# Patient Record
Sex: Male | Born: 1980
Health system: Southern US, Community
[De-identification: ages and names within clinical notes are randomized; demographics above are authoritative.]

## PROBLEM LIST (undated history)

## (undated) DIAGNOSIS — E119 Type 2 diabetes mellitus without complications: Secondary | ICD-10-CM

## (undated) DIAGNOSIS — E1165 Type 2 diabetes mellitus with hyperglycemia: Secondary | ICD-10-CM

## (undated) HISTORY — DX: Type 2 diabetes mellitus without complications: E11.9

---
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
## 2019-03-31 ENCOUNTER — Inpatient Hospital Stay (HOSPITAL_COMMUNITY)
Admission: EM | Admit: 2019-03-31 | Discharge: 2019-04-05 | DRG: 442 | Disposition: A | Discharge status: Home Health | Payer: Self-pay | Attending: Internal Medicine | Admitting: Internal Medicine

## 2019-03-31 ENCOUNTER — Emergency Department (HOSPITAL_COMMUNITY): Payer: Self-pay

## 2019-03-31 ENCOUNTER — Other Ambulatory Visit: Payer: Self-pay

## 2019-03-31 ENCOUNTER — Inpatient Hospital Stay (HOSPITAL_COMMUNITY): Payer: Self-pay

## 2019-03-31 ENCOUNTER — Encounter (HOSPITAL_COMMUNITY): Payer: Self-pay | Admitting: Emergency Medicine

## 2019-03-31 DIAGNOSIS — N12 Tubulo-interstitial nephritis, not specified as acute or chronic: Secondary | ICD-10-CM | POA: Diagnosis present

## 2019-03-31 DIAGNOSIS — E119 Type 2 diabetes mellitus without complications: Secondary | ICD-10-CM | POA: Diagnosis present

## 2019-03-31 DIAGNOSIS — D649 Anemia, unspecified: Secondary | ICD-10-CM | POA: Diagnosis present

## 2019-03-31 DIAGNOSIS — L0291 Cutaneous abscess, unspecified: Secondary | ICD-10-CM

## 2019-03-31 DIAGNOSIS — K750 Abscess of liver: Principal | ICD-10-CM | POA: Diagnosis present

## 2019-03-31 DIAGNOSIS — K7689 Other specified diseases of liver: Secondary | ICD-10-CM | POA: Diagnosis present

## 2019-03-31 DIAGNOSIS — E871 Hypo-osmolality and hyponatremia: Secondary | ICD-10-CM | POA: Diagnosis present

## 2019-03-31 DIAGNOSIS — Z20828 Contact with and (suspected) exposure to other viral communicable diseases: Secondary | ICD-10-CM | POA: Diagnosis present

## 2019-03-31 DIAGNOSIS — B961 Klebsiella pneumoniae [K. pneumoniae] as the cause of diseases classified elsewhere: Secondary | ICD-10-CM | POA: Diagnosis present

## 2019-03-31 DIAGNOSIS — R7881 Bacteremia: Secondary | ICD-10-CM

## 2019-03-31 DIAGNOSIS — N410 Acute prostatitis: Secondary | ICD-10-CM | POA: Diagnosis present

## 2019-03-31 DIAGNOSIS — R911 Solitary pulmonary nodule: Secondary | ICD-10-CM | POA: Diagnosis present

## 2019-03-31 LAB — CBC WITH DIFFERENTIAL/PLATELET
Abs Immature Granulocytes: 0.04 10*3/uL (ref 0.00–0.07)
Basophils Absolute: 0 10*3/uL (ref 0.0–0.1)
Basophils Relative: 0 %
Eosinophils Absolute: 0 10*3/uL (ref 0.0–0.5)
Eosinophils Relative: 0 %
HCT: 35.2 % — ABNORMAL LOW (ref 39.0–52.0)
Hemoglobin: 12 g/dL — ABNORMAL LOW (ref 13.0–17.0)
Immature Granulocytes: 1 %
Lymphocytes Relative: 3 %
Lymphs Abs: 0.3 10*3/uL — ABNORMAL LOW (ref 0.7–4.0)
MCH: 29.9 pg (ref 26.0–34.0)
MCHC: 34.1 g/dL (ref 30.0–36.0)
MCV: 87.8 fL (ref 80.0–100.0)
Monocytes Absolute: 0.2 10*3/uL (ref 0.1–1.0)
Monocytes Relative: 2 %
Neutro Abs: 8 10*3/uL — ABNORMAL HIGH (ref 1.7–7.7)
Neutrophils Relative %: 94 %
Platelets: 334 10*3/uL (ref 150–400)
RBC: 4.01 MIL/uL — ABNORMAL LOW (ref 4.22–5.81)
RDW: 11.4 % — ABNORMAL LOW (ref 11.5–15.5)
WBC: 8.5 10*3/uL (ref 4.0–10.5)
nRBC: 0 % (ref 0.0–0.2)

## 2019-03-31 LAB — BASIC METABOLIC PANEL
Anion gap: 15 (ref 5–15)
BUN: 14 mg/dL (ref 6–20)
CO2: 21 mmol/L — ABNORMAL LOW (ref 22–32)
Calcium: 8.1 mg/dL — ABNORMAL LOW (ref 8.9–10.3)
Chloride: 96 mmol/L — ABNORMAL LOW (ref 98–111)
Creatinine, Ser: 0.85 mg/dL (ref 0.61–1.24)
GFR calc Af Amer: >60 mL/min (ref >60)
GFR calc non Af Amer: >60 mL/min (ref >60)
Glucose, Bld: 255 mg/dL — ABNORMAL HIGH (ref 70–99)
Potassium: 3.5 mmol/L (ref 3.5–5.1)
Sodium: 132 mmol/L — ABNORMAL LOW (ref 135–145)

## 2019-03-31 LAB — COMPREHENSIVE METABOLIC PANEL
ALT: 56 U/L — ABNORMAL HIGH (ref 0–44)
AST: 38 U/L (ref 15–41)
Albumin: 2.5 g/dL — ABNORMAL LOW (ref 3.5–5.0)
Alkaline Phosphatase: 405 U/L — ABNORMAL HIGH (ref 38–126)
Anion gap: 16 — ABNORMAL HIGH (ref 5–15)
BUN: 13 mg/dL (ref 6–20)
CO2: 19 mmol/L — ABNORMAL LOW (ref 22–32)
Calcium: 8.2 mg/dL — ABNORMAL LOW (ref 8.9–10.3)
Chloride: 91 mmol/L — ABNORMAL LOW (ref 98–111)
Creatinine, Ser: 0.87 mg/dL (ref 0.61–1.24)
GFR calc Af Amer: >60 mL/min (ref >60)
GFR calc non Af Amer: >60 mL/min (ref >60)
Glucose, Bld: 285 mg/dL — ABNORMAL HIGH (ref 70–99)
Potassium: 3.4 mmol/L — ABNORMAL LOW (ref 3.5–5.1)
Sodium: 126 mmol/L — ABNORMAL LOW (ref 135–145)
Total Bilirubin: 1.9 mg/dL — ABNORMAL HIGH (ref 0.3–1.2)
Total Protein: 6.6 g/dL (ref 6.5–8.1)

## 2019-03-31 LAB — HEMOGLOBIN A1C
Hgb A1c MFr Bld: 8.9 % — ABNORMAL HIGH (ref 4.8–5.6)
Mean Plasma Glucose: 208.73 mg/dL

## 2019-03-31 LAB — URINALYSIS, ROUTINE W REFLEX MICROSCOPIC
Bilirubin Urine: NEGATIVE
Glucose, UA: >=500 mg/dL — AB
Ketones, ur: 80 mg/dL — AB
Leukocytes,Ua: NEGATIVE
Nitrite: POSITIVE — AB
Protein, ur: NEGATIVE mg/dL
Specific Gravity, Urine: 1.045 — ABNORMAL HIGH (ref 1.005–1.030)
pH: 5 (ref 5.0–8.0)

## 2019-03-31 LAB — OSMOLALITY: Osmolality: 282 mOsm/kg (ref 275–295)

## 2019-03-31 LAB — GLUCOSE, CAPILLARY
Glucose-Capillary: 203 mg/dL — ABNORMAL HIGH (ref 70–99)
Glucose-Capillary: 250 mg/dL — ABNORMAL HIGH (ref 70–99)

## 2019-03-31 LAB — PROTIME-INR
INR: 1.1 (ref 0.8–1.2)
Prothrombin Time: 14.2 seconds (ref 11.4–15.2)

## 2019-03-31 LAB — SODIUM, URINE, RANDOM: Sodium, Ur: <10 mmol/L

## 2019-03-31 LAB — SARS CORONAVIRUS 2 BY RT PCR (HOSPITAL ORDER, PERFORMED IN ~~LOC~~ HOSPITAL LAB): SARS Coronavirus 2: NEGATIVE

## 2019-03-31 LAB — OSMOLALITY, URINE: Osmolality, Ur: 531 mOsm/kg (ref 300–900)

## 2019-03-31 LAB — CBG MONITORING, ED: Glucose-Capillary: 243 mg/dL — ABNORMAL HIGH (ref 70–99)

## 2019-03-31 MED ORDER — ACETAMINOPHEN 325 MG PO TABS
650.0000 mg | ORAL_TABLET | Freq: Four times a day (QID) | ORAL | Status: DC | PRN
  Administered 2019-04-01 – 2019-04-02 (×5): 650 mg via ORAL
  Filled 2019-03-31 (×5): qty 2

## 2019-03-31 MED ORDER — FENTANYL CITRATE (PF) 100 MCG/2ML IJ SOLN
INTRAMUSCULAR | Status: AC
  Filled 2019-03-31: qty 2

## 2019-03-31 MED ORDER — IBUPROFEN 800 MG PO TABS
800.0000 mg | ORAL_TABLET | Freq: Once | ORAL | Status: AC
  Administered 2019-03-31: 800 mg via ORAL
  Filled 2019-03-31: qty 1

## 2019-03-31 MED ORDER — PROMETHAZINE HCL 25 MG PO TABS
12.5000 mg | ORAL_TABLET | Freq: Four times a day (QID) | ORAL | Status: DC | PRN
  Administered 2019-04-01: 12.5 mg via ORAL
  Filled 2019-03-31: qty 1

## 2019-03-31 MED ORDER — METRONIDAZOLE 500 MG PO TABS
500.0000 mg | ORAL_TABLET | Freq: Three times a day (TID) | ORAL | Status: DC
  Administered 2019-03-31 – 2019-04-03 (×8): 500 mg via ORAL
  Filled 2019-03-31 (×8): qty 1

## 2019-03-31 MED ORDER — POLYETHYLENE GLYCOL 3350 17 G PO PACK
17.0000 g | PACK | Freq: Every day | ORAL | Status: DC
  Administered 2019-04-01 – 2019-04-03 (×2): 17 g via ORAL
  Filled 2019-03-31 (×3): qty 1

## 2019-03-31 MED ORDER — SODIUM CHLORIDE 0.9 % IV SOLN
INTRAVENOUS | Status: AC
  Administered 2019-03-31: 17:00:00 via INTRAVENOUS

## 2019-03-31 MED ORDER — MIDAZOLAM HCL 2 MG/2ML IJ SOLN
INTRAMUSCULAR | Status: AC | PRN
  Administered 2019-03-31: 1 mg via INTRAVENOUS

## 2019-03-31 MED ORDER — FENTANYL CITRATE (PF) 100 MCG/2ML IJ SOLN
INTRAMUSCULAR | Status: AC | PRN
  Administered 2019-03-31: 50 ug via INTRAVENOUS
  Administered 2019-03-31: 25 ug via INTRAVENOUS

## 2019-03-31 MED ORDER — SODIUM CHLORIDE 0.9 % IV SOLN
2.0000 g | INTRAVENOUS | Status: DC
  Administered 2019-03-31 – 2019-04-05 (×6): 2 g via INTRAVENOUS
  Filled 2019-03-31 (×7): qty 20

## 2019-03-31 MED ORDER — MIDAZOLAM HCL 2 MG/2ML IJ SOLN
INTRAMUSCULAR | Status: AC
  Filled 2019-03-31: qty 2

## 2019-03-31 MED ORDER — SODIUM CHLORIDE 0.9% FLUSH
5.0000 mL | Freq: Three times a day (TID) | INTRAVENOUS | Status: DC
  Administered 2019-03-31 – 2019-04-05 (×15): 5 mL

## 2019-03-31 MED ORDER — LIDOCAINE HCL 1 % IJ SOLN
INTRAMUSCULAR | Status: AC
  Filled 2019-03-31: qty 20

## 2019-03-31 MED ORDER — ACETAMINOPHEN 650 MG RE SUPP: 650.0000 mg | Freq: Four times a day (QID) | RECTAL | Status: DC | PRN

## 2019-03-31 MED ORDER — HYDROCODONE-ACETAMINOPHEN 5-325 MG PO TABS
1.0000 | ORAL_TABLET | ORAL | Status: DC | PRN
  Filled 2019-03-31: qty 1

## 2019-03-31 MED ORDER — MIDAZOLAM HCL 2 MG/2ML IJ SOLN
INTRAMUSCULAR | Status: AC | PRN
  Administered 2019-03-31 (×2): 0.5 mg via INTRAVENOUS

## 2019-03-31 MED ORDER — FENTANYL CITRATE (PF) 100 MCG/2ML IJ SOLN
INTRAMUSCULAR | Status: AC | PRN
  Administered 2019-03-31: 25 ug via INTRAVENOUS

## 2019-03-31 MED ORDER — INSULIN ASPART 100 UNIT/ML ~~LOC~~ SOLN
0.0000 [IU] | Freq: Three times a day (TID) | SUBCUTANEOUS | Status: DC
  Administered 2019-03-31: 3 [IU] via SUBCUTANEOUS
  Administered 2019-04-01: 13:00:00 7 [IU] via SUBCUTANEOUS
  Administered 2019-04-01: 5 [IU] via SUBCUTANEOUS
  Administered 2019-04-01: 09:00:00 3 [IU] via SUBCUTANEOUS

## 2019-03-31 MED ORDER — SENNOSIDES-DOCUSATE SODIUM 8.6-50 MG PO TABS: 1.0000 | ORAL_TABLET | Freq: Every evening | ORAL | Status: DC | PRN

## 2019-03-31 MED ORDER — IOHEXOL 350 MG/ML SOLN
100.0000 mL | Freq: Once | INTRAVENOUS | Status: AC | PRN
  Administered 2019-03-31: 100 mL via INTRAVENOUS

## 2019-03-31 MED ORDER — ENOXAPARIN SODIUM 40 MG/0.4ML ~~LOC~~ SOLN
40.0000 mg | SUBCUTANEOUS | Status: DC
  Administered 2019-03-31 – 2019-04-02 (×3): 40 mg via SUBCUTANEOUS
  Filled 2019-03-31 (×5): qty 0.4

## 2019-03-31 NOTE — ED Notes (Signed)
Pt transported to IR for drain placement.   

## 2019-03-31 NOTE — ED Notes (Signed)
[place] removed - pt states he is not sob and sats remained 98% on RA.

## 2019-03-31 NOTE — Progress Notes (Signed)
Polo Mcmartin [id number] Admission Data: 03/31/2019 5:08 PM Attending Provider: Bartholomew Crews, MD  JIR:[id number], No Pcp Per Consults/ Treatment Team:   Gary Bradshaw is a 38 y.o. male patient admitted from ED awake, alert  & orientated  X 3,  Full Code, VSS - Blood pressure 115/88, pulse 85, temperature 98.1 F (36.7 C), temperature source Oral, resp. rate (!) 21, height 5\' 8"  (1.727 m), weight 68 kg, SpO2 100 %. no c/o shortness of breath, no c/o chest pain, no distress noted. Tele # 19 placed and pt is currently running:normal sinus rhythm.   IV site WDL:  antecubital left, condition patent and no redness with a transparent dsg that's clean dry and intact.  Allergies:  No Known Allergies   History reviewed. No pertinent past medical history.  History:  obtained from chart review. Tobacco/alcohol: denied none  Pt orientation to unit, room and routine. Information packet given to patient/family and safety video watched.  Admission INP armband ID verified with patient/family, and in place. SR up x 2, fall risk assessment complete with Patient and family verbalizing understanding of risks associated with falls. Pt verbalizes an understanding of how to use the call bell and to call for help before getting out of bed.  Skin, clean-dry- intact without evidence of bruising, or skin tears.   No evidence of skin break down noted on exam. color normal, vascularity normal, no rashes or suspicious lesions, no evidence of bleeding or bruising, no lesions noted, no rash, no edema, temperature normal, texture normal, mobility and turgor normal. Drain on left side to gravity with serosanguinous drainage noted.  Pt speaks English with [place] as his primary language.  Will cont to monitor and assist as needed.  Cathlean Marseilles Tamala Julian, MSN, RN, [place], [place] 03/31/2019 5:08 PM

## 2019-03-31 NOTE — ED Notes (Signed)
Pt to ct 

## 2019-03-31 NOTE — ED Triage Notes (Addendum)
Patient here with sudden onset of shortness of breath, he has been sick for 14 days. States that he is not around anyone, that he is usually alone when he goes out for walks.  He states this is the first time he has been to a hospital since being in [place] (13 years).  He denies being exposed to anyone with Covid.  He denies any chest pain.

## 2019-03-31 NOTE — Progress Notes (Signed)
MEDICATION-RELATED CONSULT NOTE   IR Procedure Consult - Anticoagulant/Antiplatelet PTA/Inpatient Med List Review by Pharmacist   Procedure:  Drain placement for hepatic abscess    Completed: 7/31 at 1550  Post-Procedural bleeding risk per IR MD assessment:  standard  Antithrombotic medications on inpatient or PTA profile prior to procedure:      Lovenox 40 mg SQ q24hrs for VTE prophylaxis ordered 7/31 am, while patient was in ED.  Recommended restart time per IR Post-Procedure Guidelines:     One day after procedure  Assessment/Plan:       Lovenox 40 mg SQ given at 1649 today.   Notified Dr. Vernard Gambles.   Daily Lovenox dose rescheduled for 6pm daily, next dose 8/1 pm.     Arty Baumgartner, [place] Pager: [phone] 03/31/2019 6:01 PM

## 2019-03-31 NOTE — ED Provider Notes (Signed)
Emergency Department Provider Note   I have reviewed the triage vital signs and the nursing notes.   HISTORY  Chief Complaint Respiratory Distress   HPI Gary Bradshaw is a 38 y.o. male who presents with multiple symptoms.  Patient states that he has had fevers basically daily for last 2 weeks.  He just bought a thermometer a few days ago but since that time he had temperatures as high as 104 mostly at 102 or lower.  It always improves with Tylenol.  Would last few days also worsening shortness of breath.  No cough.  Had body aches about 10 days ago but that not come back.  He says he has been in his house for the last month and half has been exposed anyone.  He does not have a cough.  No lower extremity swelling.  Never had anything like this before.  Also states that he is having diaphoresis when he sleeps.  Has been having some gas pain is intermittent in nature which often causes him to vomit.  When he vomits it does seem to get better..   No other associated or modifying symptoms.   History reviewed. No pertinent past medical history.  There are no active problems to display for this patient.   History reviewed. No pertinent surgical history.    Allergies Patient has no known allergies.  No family history on file.  Social History Social History   Tobacco Use  . Smoking status: Not on file  Substance Use Topics  . Alcohol use: Not on file  . Drug use: Not on file    Review of Systems  All other systems negative except as documented in the HPI. All pertinent positives and negatives as reviewed in the HPI. ____________________________________________   PHYSICAL EXAM:  VITAL SIGNS: ED Triage Vitals  Enc Vitals Group     BP 03/31/19 0345 (!) 143/91     Pulse Rate 03/31/19 0345 (!) 144     Resp 03/31/19 0345 (!) 28     Temp 03/31/19 0345 99.7 F (37.6 C)     Temp Source 03/31/19 0345 Oral     SpO2 03/31/19 0345 95 %     Weight --      Height --      Head  Circumference --      Peak Flow --      Pain Score 03/31/19 0406 0     Pain Loc --      Pain Edu? --      Excl. in GC? --     Constitutional: Alert and oriented. Well appearing and in no acute distress. Eyes: Conjunctivae are normal. PERRL. EOMI. Head: Atraumatic. Nose: No congestion/rhinnorhea. Mouth/Throat: Mucous membranes are moist.  Oropharynx non-erythematous. Neck: No stridor.  No meningeal signs.   Cardiovascular: Tachycardic rate, regular rhythm. Good peripheral circulation. Grossly normal heart sounds.   Respiratory: Tachypneic respiratory effort.  No retractions. Lungs CTAB.  Not able to speak in full sentences Gastrointestinal: Soft and nontender. No distention.  Musculoskeletal: No lower extremity tenderness nor edema. No gross deformities of extremities. Neurologic:  Normal speech and language. No gross focal neurologic deficits are appreciated.  Skin:  Skin is warm, diaphoretic and intact. No rash noted.   ____________________________________________   LABS (all labs ordered are listed, but only abnormal results are displayed)  Labs Reviewed  CBC WITH DIFFERENTIAL/PLATELET - Abnormal; Notable for the following components:      Result Value   RBC 4.01 (*)  Hemoglobin 12.0 (*)    HCT 35.2 (*)    RDW 11.4 (*)    Neutro Abs 8.0 (*)    Lymphs Abs 0.3 (*)    All other components within normal limits  COMPREHENSIVE METABOLIC PANEL - Abnormal; Notable for the following components:   Sodium 126 (*)    Potassium 3.4 (*)    Chloride 91 (*)    CO2 19 (*)    Glucose, Bld 285 (*)    Calcium 8.2 (*)    Albumin 2.5 (*)    ALT 56 (*)    Alkaline Phosphatase 405 (*)    Total Bilirubin 1.9 (*)    Anion gap 16 (*)    All other components within normal limits  SARS CORONAVIRUS 2 (HOSPITAL ORDER, PERFORMED IN [facility])   ____________________________________________  EKG   EKG Interpretation  Date/Time:  Friday March 31 2019 03:55:05 EDT Ventricular  Rate:  132 PR Interval:  112 QRS Duration: 70 QT Interval:  328 QTC Calculation: 485 R Axis:   55 Text Interpretation:  Sinus tachycardia T wave abnormality, consider inferior ischemia Abnormal ECG No old tracing to compare Confirmed by Marily MemosMesner, Denis Carreon [phone]([id number]) on 03/31/2019 4:45:28 AM       ____________________________________________  RADIOLOGY  Dg Chest Portable 1 View  Result Date: 03/31/2019 CLINICAL DATA:  Shortness of breath EXAM: PORTABLE CHEST 1 VIEW COMPARISON:  None. FINDINGS: Artifact from EKG leads. Normal heart size and mediastinal contours. No acute infiltrate or edema. No effusion or pneumothorax. No acute osseous findings. IMPRESSION: No active disease. Electronically Signed   By: Marnee SpringJonathon  Watts M.D.   On: 03/31/2019 05:08    ____________________________________________   PROCEDURES  Procedure(s) performed:   Procedures   ____________________________________________   INITIAL IMPRESSION / ASSESSMENT AND PLAN / ED COURSE  Patient could have coronavirus however it does not sound like he is really been exposed to people at all however does with his girlfriend who may have been.  Also concern for possible abscess as he has diaphoresis and fevers at night for 2 weeks that are not going away.  He is very tachypneic this could be related to fever versus could also be pulmonary embolus as his sats were 95% when he got here and has S1Q3T3 on his EKG.  Plan to do CT scans, labs and check for coronavirus.  Patient likely need to be admitted either way unless something simple is found.   Clinical Course as of Mar 31 229  Fri Mar 31, 2019  0618 Elevated liver labs - cholangitis? Pending CT   Comprehensive metabolic panel(!) [JM]  0618 Covid?  Lymphocyte #(!): 0.3 [JM]  [id number] Improved to 102 without intervention, possibly dehydrated/fever?   Pulse Rate(!): 144 [JM]  0619 Started oxygen for this and tachypnea, seems to be improving his symptoms as well  SpO2: 95 % [JM]   0937 COVID test is negative.   [JK]  1025 Case was discussed with Dr. Deanne CofferHassell.  Someone from interventional radiology will evaluate the patient regarding IR drainage, probable drain placement.   [JK]    Clinical Course User Index [JK] Linwood DibblesKnapp, Jon, MD [JM] Rylyn Zawistowski, Barbara CowerJason, MD    Pertinent labs & imaging results that were available during my care of the patient were reviewed by me and considered in my medical decision making (see chart for details).   ____________________________________________  FINAL CLINICAL IMPRESSION(S) / ED DIAGNOSES  Final diagnoses:  Abscess  Hepatic abscess     MEDICATIONS GIVEN DURING THIS  VISIT:  Medications  ibuprofen (ADVIL) tablet 800 mg (has no administration in time range)     NEW OUTPATIENT MEDICATIONS STARTED DURING THIS VISIT:  New Prescriptions   No medications on file    Note:  This note was prepared with assistance of Dragon voice recognition software. Occasional wrong-word or sound-a-like substitutions may have occurred due to the inherent limitations of voice recognition software.   Clee Pandit, Corene Cornea, MD 04/01/19 0230

## 2019-03-31 NOTE — Procedures (Signed)
  Procedure: CT hepatic abscess drain placement 31ml purulent for GS, C&S EBL:   minimal Complications:  none immediate  See full dictation in [facility].  Dillard Cannon MD Main # [phone] Pager  [phone]

## 2019-03-31 NOTE — Consult Note (Signed)
Chief Complaint: Patient was seen in consultation today for hepatic abscess aspiration/possible drain placement.  Referring Physician(s): Dr. Lynelle DoctorKnapp  Supervising Physician: Oley BalmHassell, Daniel  [facility]  History of Present Illness: Gary Bradshaw is a 38 y.o. male with no significant past medical history who [facility] early this morning with complaints of worsening dyspnea for approximately 2 weeks with fevers, body aches, sweating and abdominal pain. He recently also developed vomiting after eating. A CT abd/pelvis w/contrast was performed for further evaluation which showed a 6 x 9 x 8 cm right hepatic complex cystic lesion, likely representing an abscess as well as an 8 mm left lower lobe pulmonary nodule and nonspecific enlargement of the right prostate. Request has been made to IR for possible aspiration/drain placement within the hepatic abscess.  Patient states he continues to have abdominal pain which is unchanged, he was taking Tylenol at home for his fevers which would improve his temperature briefly but then he would again spike fevers between 102-104 F. The last time he took Tylenol was around 9 pm last night. He denies any nausea or vomiting today. He states he does not feel short of breath while laying in bed with oxygen on. He did not take any medications this morning and he has not had anything to eat or drink since last night around dinner time. His girlfriend is on the phone during our consultation as well. They both state understanding of the requested procedure and [name] would like to proceed.   History reviewed. No pertinent past medical history.  History reviewed. No pertinent surgical history.  Allergies: Patient has no known allergies.  Medications: Prior to Admission medications   Not on File     No family history on file.  Social History   Socioeconomic History  . Marital status: Single    Spouse name: Not on file  . Number of  children: Not on file  . Years of education: Not on file  . Highest education level: Not on file  Occupational History  . Not on file  Social Needs  . Financial resource strain: Not on file  . Food insecurity    Worry: Not on file    Inability: Not on file  . Transportation needs    Medical: Not on file    Non-medical: Not on file  Tobacco Use  . Smoking status: Not on file  Substance and Sexual Activity  . Alcohol use: Not on file  . Drug use: Not on file  . Sexual activity: Not on file  Lifestyle  . Physical activity    Days per week: Not on file    Minutes per session: Not on file  . Stress: Not on file  Relationships  . Social Musicianconnections    Talks on phone: Not on file    Gets together: Not on file    Attends religious service: Not on file    Active member of club or organization: Not on file    Attends meetings of clubs or organizations: Not on file    Relationship status: Not on file  Other Topics Concern  . Not on file  Social History Narrative  . Not on file     Review of Systems: A 12 point ROS discussed and pertinent positives are indicated in the HPI above.  All other systems are negative.  Review of Systems  Constitutional: Positive for appetite change, diaphoresis, fatigue and fever.  Respiratory: Positive for shortness of  breath. Negative for cough.   Cardiovascular: Negative for chest pain.  Gastrointestinal: Positive for abdominal pain, nausea and vomiting. Negative for diarrhea.  Genitourinary: Negative for dysuria and hematuria.  Musculoskeletal: Negative for back pain.  Skin: Negative for rash.  Neurological: Negative for dizziness.    Vital Signs: BP 119/84 (BP Location: Right Arm)   Pulse 84   Temp 99.7 F (37.6 C) (Oral)   Resp (!) 24   Ht 5\' 8"  (1.727 m)   Wt 150 lb (68 kg)   SpO2 99%   BMI 22.81 kg/m   Physical Exam Vitals signs and nursing note reviewed.  Constitutional:      General: He is not in acute distress.     Appearance: He is diaphoretic.  HENT:     Head: Normocephalic.  Cardiovascular:     Rate and Rhythm: Normal rate and regular rhythm.  Pulmonary:     Effort: Pulmonary effort is normal.     Breath sounds: Normal breath sounds.     Comments: (+) supplemental O2 via [place] Abdominal:     General: Bowel sounds are normal. There is no distension.     Palpations: Abdomen is soft.     Tenderness: There is abdominal tenderness (RUQ).  Skin:    General: Skin is warm.     Coloration: Skin is not jaundiced.     Findings: No bruising.  Neurological:     Mental Status: He is alert and oriented to person, place, and time.  Psychiatric:        Mood and Affect: Mood normal.        Behavior: Behavior normal.        Thought Content: Thought content normal.        Judgment: Judgment normal.      MD Evaluation Airway: WNL Heart: WNL Abdomen: WNL Chest/ Lungs: WNL ASA  Classification: 2 Mallampati/Airway Score: One   Imaging: Ct Angio Chest Pe W And/or Wo Contrast  Result Date: 03/31/2019 CLINICAL DATA:  38 year old male with shortness of breath, fever, abdominal pain and nausea for 2 weeks. EXAM: CT ANGIOGRAPHY CHEST CT ABDOMEN AND PELVIS WITH CONTRAST TECHNIQUE: Multidetector CT imaging of the chest was performed using the standard protocol during bolus administration of intravenous contrast. Multiplanar CT image reconstructions and MIPs were obtained to evaluate the vascular anatomy. Multidetector CT imaging of the abdomen and pelvis was performed using the standard protocol during bolus administration of intravenous contrast. CONTRAST:  149mL OMNIPAQUE IOHEXOL 350 MG/ML SOLN COMPARISON:  None. FINDINGS: CTA CHEST FINDINGS Cardiovascular: This is a technically satisfactory study. There is no evidence of pulmonary emboli. Heart size normal. No evidence of thoracic aortic aneurysm or dissection. No pericardial effusion. Mediastinum/Nodes: No enlarged mediastinal, hilar, or axillary lymph nodes.  Thyroid gland, trachea, and esophagus demonstrate no significant findings. Lungs/Pleura: An 8 mm noncalcified nodule at the LEFT LOWER lobe base is identified (Series 8: Image 71). The lungs are otherwise clear. No airspace disease, consolidation, mass, pleural effusion or pneumothorax. Musculoskeletal: No acute or suspicious bony abnormalities identified. Review of the MIP images confirms the above findings. CT ABDOMEN and PELVIS FINDINGS Hepatobiliary: A 6 x 9 x 8 cm cystic lesion within the RIGHT liver contains multiple septations. This is nonspecific but is suspicious for abscess given patient's symptoms. The remainder of the liver and gallbladder are unremarkable. No biliary dilatation. Pancreas: Unremarkable Spleen: Unremarkable Adrenals/Urinary Tract: Subtle wedge-shaped areas of differential attenuation within the kidneys are noted and could represent pyelonephritis. No other renal abnormalities  identified. The adrenal glands and bladder are unremarkable. Stomach/Bowel: Stomach is within normal limits. No evidence of bowel wall thickening, distention, or inflammatory changes. Vascular/Lymphatic: No significant vascular findings are present. No enlarged abdominal or pelvic lymph nodes. Reproductive: Heterogeneous enlargement of the RIGHT prostate. Other: No ascites, pneumoperitoneum or abdominal wall hernia. Musculoskeletal: No acute or suspicious bony abnormalities. Review of the MIP images confirms the above findings. IMPRESSION: 1. 6 x 9 x 8 cm RIGHT hepatic complex cystic lesion, likely representing an abscess given patient's clinical history. 2. Subtle wedge-shaped areas of differential perfusion within both kidneys which could represent pyelonephritis. No evidence of renal abscess. 3. Nonspecific heterogeneous enlargement of the RIGHT prostate. Correlate clinically. 4. 8 mm LEFT LOWER lobe pulmonary nodule. Non-contrast chest CT at 6-12 months is recommended. If the nodule is stable at time of repeat  CT, then future CT at 18-24 months (from today's scan) is considered optional for low-risk patients, but is recommended for high-risk patients. This recommendation follows the consensus statement: Guidelines for Management of Incidental Pulmonary Nodules Detected on CT Images: From the Fleischner Society 2017; Radiology 2017; 284:228-243. Electronically Signed   By: Harmon PierJeffrey  Hu M.D.   On: 03/31/2019 09:37   Ct Abdomen Pelvis W Contrast  Result Date: 03/31/2019 CLINICAL DATA:  38 year old male with shortness of breath, fever, abdominal pain and nausea for 2 weeks. EXAM: CT ANGIOGRAPHY CHEST CT ABDOMEN AND PELVIS WITH CONTRAST TECHNIQUE: Multidetector CT imaging of the chest was performed using the standard protocol during bolus administration of intravenous contrast. Multiplanar CT image reconstructions and MIPs were obtained to evaluate the vascular anatomy. Multidetector CT imaging of the abdomen and pelvis was performed using the standard protocol during bolus administration of intravenous contrast. CONTRAST:  100mL OMNIPAQUE IOHEXOL 350 MG/ML SOLN COMPARISON:  None. FINDINGS: CTA CHEST FINDINGS Cardiovascular: This is a technically satisfactory study. There is no evidence of pulmonary emboli. Heart size normal. No evidence of thoracic aortic aneurysm or dissection. No pericardial effusion. Mediastinum/Nodes: No enlarged mediastinal, hilar, or axillary lymph nodes. Thyroid gland, trachea, and esophagus demonstrate no significant findings. Lungs/Pleura: An 8 mm noncalcified nodule at the LEFT LOWER lobe base is identified (Series 8: Image 71). The lungs are otherwise clear. No airspace disease, consolidation, mass, pleural effusion or pneumothorax. Musculoskeletal: No acute or suspicious bony abnormalities identified. Review of the MIP images confirms the above findings. CT ABDOMEN and PELVIS FINDINGS Hepatobiliary: A 6 x 9 x 8 cm cystic lesion within the RIGHT liver contains multiple septations. This is  nonspecific but is suspicious for abscess given patient's symptoms. The remainder of the liver and gallbladder are unremarkable. No biliary dilatation. Pancreas: Unremarkable Spleen: Unremarkable Adrenals/Urinary Tract: Subtle wedge-shaped areas of differential attenuation within the kidneys are noted and could represent pyelonephritis. No other renal abnormalities identified. The adrenal glands and bladder are unremarkable. Stomach/Bowel: Stomach is within normal limits. No evidence of bowel wall thickening, distention, or inflammatory changes. Vascular/Lymphatic: No significant vascular findings are present. No enlarged abdominal or pelvic lymph nodes. Reproductive: Heterogeneous enlargement of the RIGHT prostate. Other: No ascites, pneumoperitoneum or abdominal wall hernia. Musculoskeletal: No acute or suspicious bony abnormalities. Review of the MIP images confirms the above findings. IMPRESSION: 1. 6 x 9 x 8 cm RIGHT hepatic complex cystic lesion, likely representing an abscess given patient's clinical history. 2. Subtle wedge-shaped areas of differential perfusion within both kidneys which could represent pyelonephritis. No evidence of renal abscess. 3. Nonspecific heterogeneous enlargement of the RIGHT prostate. Correlate clinically. 4. 8 mm LEFT  LOWER lobe pulmonary nodule. Non-contrast chest CT at 6-12 months is recommended. If the nodule is stable at time of repeat CT, then future CT at 18-24 months (from today's scan) is considered optional for low-risk patients, but is recommended for high-risk patients. This recommendation follows the consensus statement: Guidelines for Management of Incidental Pulmonary Nodules Detected on CT Images: From the Fleischner Society 2017; Radiology 2017; 284:228-243. Electronically Signed   By: Harmon PierJeffrey  Hu M.D.   On: 03/31/2019 09:37   Dg Chest Portable 1 View  Result Date: 03/31/2019 CLINICAL DATA:  Shortness of breath EXAM: PORTABLE CHEST 1 VIEW COMPARISON:  None.  FINDINGS: Artifact from EKG leads. Normal heart size and mediastinal contours. No acute infiltrate or edema. No effusion or pneumothorax. No acute osseous findings. IMPRESSION: No active disease. Electronically Signed   By: Marnee SpringJonathon  Watts M.D.   On: 03/31/2019 05:08    Labs:  CBC: Recent Labs    03/31/19 0530  WBC 8.5  HGB 12.0*  HCT 35.2*  PLT 334    COAGS: No results for input(s): INR, APTT in the last 8760 hours.  BMP: Recent Labs    03/31/19 0530  NA 126*  K 3.4*  CL 91*  CO2 19*  GLUCOSE 285*  BUN 13  CALCIUM 8.2*  CREATININE 0.87  GFRNONAA >60  GFRAA >60    LIVER FUNCTION TESTS: Recent Labs    03/31/19 0530  BILITOT 1.9*  AST 38  ALT 56*  ALKPHOS 405*  PROT 6.6  ALBUMIN 2.5*    TUMOR MARKERS: No results for input(s): AFPTM, CEA, CA199, CHROMGRNA in the last 8760 hours.  Assessment and Plan:  38 y/o M with no significant past medical history who [facility] this [facility] with complaints of progressive dyspnea, abdominal pain, nausea, vomiting, fevers, chills and diaphoresis for approximately 4 weeks which have recently worsened to the point that the patient presented to the ED for evaluation. Imaging shows hepatic abscess and IR has been asked to evaluate patient for possible image guided aspiration/possible drain placement - patient was reviewed by Dr. Deanne CofferHassell who agrees to procedure in CT, tentatively planned for this afternoon if time allows.  Patient as been NPO since yesterday evening, he did not take any medications this morning. Afebrile currently at 99.87F, tachypneic at 24,  SpO2 100% with [place] in place. COVID (-), WBC 8.5, hgb 12.0, plt 334, INR 1.1, ALP 405, t.bili 1.9.  Risks and benefits discussed with the patient including bleeding, infection, damage to adjacent structures, bowel perforation/fistula connection, and sepsis.  All of the patient's questions were answered, patient is agreeable to proceed.  Consent signed and in IR  control room.  Thank you for this interesting consult.  I greatly enjoyed meeting Gary SpringsLei Cheeks and look forward to participating in their care.  A copy of this report was sent to the requesting provider on this date.  Electronically Signed: Villa HerbShannon A Watterson, PA-C 03/31/2019, 11:27 AM   I spent a total of 40 Minutes in face to face in clinical consultation, greater than 50% of which was counseling/coordinating care for hepatic abscess aspiration/drain placement.

## 2019-03-31 NOTE — H&P (Addendum)
Date: 03/31/2019               Patient Name:  Gary Bradshaw MRN: [id number]  DOB: 07/24/1981 Age / Sex: 38 y.o., male   PCP: Patient, No Pcp Per         Medical Service: Internal Medicine Teaching Service         Attending Physician: Dr. Rogelia BogaButcher, Austin MilesElizabeth A, MD    First Contact: Dr. Huel CoteBasaraba Pager: [phone]  Second Contact: Dr. Caron PresumeHelberg Pager: [phone]       After Hours (After 5p/  First Contact Pager: [phone]  weekends / holidays): Second Contact Pager: [phone]   Chief Complaint: Dyspnea and fever   History of Present Illness:  Gary Bradshaw is a 38 year old male with no significant past medical history who presents with sudden onset shortness of breath for the past 14 days.  He has had accompanied fever with temperatures ranging 102-104 which have improved with Tylenol.  He is also had body aches, sweating, and intermittent abdominal discomfort with flatus. States that he has vomited after eating 4 times over the past few days. Feels that he may have lost some weight, states that his usual weight is 145lbs.   States that he lives alone and has not been in contact with anyone over the past month.  He only occasionally goes out for walks.  Does not have any known COVID contacts.  Denies cough, chest pain, lower extremity swelling, changes in stool color.  In the ED, patient had a slightly elevated temperature of 99.7, tachycardic ranging 90-1 40s, tachypneic ranging 27-35, mildly hypertensive 110-140s/70s-90s, saturating in the 90s on nasal cannula 1-2L which was later removed as he was not requiring.  He received a dose of 800 mg ibuprofen.  He got a CTA chest, CT abdomen, chest x-ray done.  Meds:  No outpatient medications have been marked as taking for the 03/31/19 encounter [facility](Hospital Encounter).    Allergies: Allergies as of 03/31/2019  . (No Known Allergies)   History reviewed. No pertinent past medical history.  Family History:   Social History:  Lived in [place] for past 13  years, came from [place] Not employed  Lives alone  Never smoker  Does not drink etoh  Never used ivdu, cocaine, marijuana, heroin  Review of Systems: A complete ROS was negative except as per HPI.   Physical Exam: Blood pressure 119/84, pulse 84, temperature 99.7 F (37.6 C), temperature source Oral, resp. rate (!) 24, height 5\' 8"  (1.727 m), weight 68 kg, SpO2 99 %.  Physical Exam  Constitutional: He is oriented to person, place, and time. He appears well-developed and well-nourished. No distress.  HENT:  Head: Normocephalic and atraumatic.  Eyes: Conjunctivae are normal.  Neck: No JVD present.  Cardiovascular: Normal rate, regular rhythm and normal heart sounds.  Respiratory: Effort normal and breath sounds normal. No respiratory distress. He has no wheezes.  GI: Soft. Bowel sounds are normal. He exhibits no distension. There is no abdominal tenderness.  Musculoskeletal:        General: No edema.  Neurological: He is alert and oriented to person, place, and time.  Skin: He is diaphoretic.  Psychiatric: He has a normal mood and affect. His behavior is normal. Judgment and thought content normal.   Labs: CBC: WBC 8.5, Hb 12.0, HCT 35.2, MCV 87.8, RDW 11.4, PLT 334, absolute neutrophil 8.0, absolute lymphocytes 0.3 CMP: NA 126 (corrected 130), K3.4, bicarb 19, glucose 285, CR 0.87, albumin 2.5, AST 38,  ALT 56, ALP 405, T bili 1.9, AG 16 SARS-CoV-2 negative  EKG: personally reviewed my interpretation is sinus tachycardia with pulse rate approximately 130s, no significant ST changes, nonspecific T wave changes in the lateral leads.  CXR: personally reviewed my interpretation is no consolidation, no significant effusion, no significantly increased interstitial markings.  CT abdomen pelvis with contrast 6 x 9 x 8 cm right hepatic complex cystic lesion likely representing an abscess.  Subtle wedge-shaped area of differential perfusion within both kidneys which could  represent pyelonephritis without evidence of renal absence.  Nonspecific heterogenous enlargement in the right prostate.  CT angio chest PE 4.8 mm left lower lobe pulmonary nodule  Assessment & Plan by Problem: Active Problems:   Liver mass, right lobe   Gary Bradshaw is a 38 y.o immigrant male from [place] without any known past medical history who presents with two week history of fever, generalized body aches, and sweating. Found to have a large hepatic cyst/mass/abscess.   Right hepatic complex cystic mass/cyst/abscess CT abdomen pelvis showed a 6 x 9 x 8 cm cystic lesion in the right liver containing multiple septations which is concerning for possible abscess given clinical picture. The patient is febrile, elevated alt 56, alp 405 has elevated absolute neutrophil count and is acutely is sick and infectious process is probable. However, it is peculiar that the patient does not have an elevated wbc count. Unlikely amebic liver abscess as [place] does not have much prevalence of Entamoeba.   Will also need to consider possibility of malignancy. [place] vs metastasis. Will order Hepatitis serologies for [name] workup. He does not have significant risk factors for NAFLD. Patient also has a pulmonary nodule there is also some possibility of there to be metastasis to liver.   Due to mass/abscess being large >3cm, it cannot be solely treated with antibiotics and will rather need intervention. IR to do aspiration and drainage. Will hold off antibiotics till get an intraoperative culture as the patient is currently stable.   No presence of diverticulitis as a possible cause of spread to liver causing abscess. Possible biliary infection or obstruction.  -Interventional Radiology to see for drain placement  -Holding antibiotics, but if he decompensates then add antibiotics -NPO at midnight as IR plans to do procedure am of 04/01/19 -Blood cultures ordered and pending -Hepatitis serologies  -consider AFP  -Pending HIV  Wedge shaped differential perfusion in both kidneys  Seen on CT abdomen that is concerning for patient having pyelonephritis. Patient does not have abdominal pain. Will do urinalysis and urine culture to verify.   -Pending urine analysis and urine culture  Hyponatremia The patient's na on admission was 124, corrected na was 130. Likely hypovolemic hyponatremia in the setting of losses from sweating and vomiting.  -called nurse and informed to get urine studies prior to starting fluids  -NS 149ml/hr for 10hrs  -bmp q4hr check to assess na correction, goal correction 130 by 5am 8/1.  Probable Diabetes  Patient with new diagnosis of probable diabetes given that he has a random blood glucose level>200, however it is not completely clear whether he is symptomatic due to it as he also has a concomittent acute illness. Checking a1c.   -Pending HbA1c  -SSI Sensitive  Left lower lobe pulmonary nodule Seen on CT angios chest done 03/31/2019.  -Repeat noncontrast chest CT in 6 to 12 months  Dispo: Admit patient to Inpatient with expected length of stay greater than 2 midnights.  Signed: Lars Mage, MD 03/31/2019,  11:40 AM  Pager: [phone]

## 2019-03-31 NOTE — ED Provider Notes (Signed)
Patient initially was seen by Dr. Dolly Rias.  Please see his note.  Patient's COVID test is negative.  His CT scan demonstrates a large cystic lesion in the liver concerning for possible abscess.  The patient remains hemodynamically stable.  I have spoken with Dr. Vernard Gambles, interventional radiology.  They will plan on evaluating the patient for aspiration and drain placement.  I will consult the medical service for admission.   Dorie Rank, MD 03/31/19 1027

## 2019-04-01 DIAGNOSIS — R7881 Bacteremia: Secondary | ICD-10-CM

## 2019-04-01 LAB — BLOOD CULTURE ID PANEL (REFLEXED)

## 2019-04-01 LAB — CBC WITH DIFFERENTIAL/PLATELET
Abs Immature Granulocytes: 0.03 10*3/uL (ref 0.00–0.07)
Basophils Absolute: 0 10*3/uL (ref 0.0–0.1)
Basophils Relative: 0 %
Eosinophils Absolute: 0 10*3/uL (ref 0.0–0.5)
Eosinophils Relative: 0 %
HCT: 31.5 % — ABNORMAL LOW (ref 39.0–52.0)
Hemoglobin: 10.8 g/dL — ABNORMAL LOW (ref 13.0–17.0)
Immature Granulocytes: 0 %
Lymphocytes Relative: 7 %
Lymphs Abs: 0.6 10*3/uL — ABNORMAL LOW (ref 0.7–4.0)
MCH: 29.9 pg (ref 26.0–34.0)
MCHC: 34.3 g/dL (ref 30.0–36.0)
MCV: 87.3 fL (ref 80.0–100.0)
Monocytes Absolute: 0.4 10*3/uL (ref 0.1–1.0)
Monocytes Relative: 5 %
Neutro Abs: 6.8 10*3/uL (ref 1.7–7.7)
Neutrophils Relative %: 88 %
Platelets: 336 10*3/uL (ref 150–400)
RBC: 3.61 MIL/uL — ABNORMAL LOW (ref 4.22–5.81)
RDW: 11.7 % (ref 11.5–15.5)
WBC: 7.8 10*3/uL (ref 4.0–10.5)
nRBC: 0 % (ref 0.0–0.2)

## 2019-04-01 LAB — COMPREHENSIVE METABOLIC PANEL
ALT: 49 U/L — ABNORMAL HIGH (ref 0–44)
AST: 37 U/L (ref 15–41)
Albumin: 2 g/dL — ABNORMAL LOW (ref 3.5–5.0)
Alkaline Phosphatase: 305 U/L — ABNORMAL HIGH (ref 38–126)
Anion gap: 14 (ref 5–15)
BUN: 12 mg/dL (ref 6–20)
CO2: 19 mmol/L — ABNORMAL LOW (ref 22–32)
Calcium: 7.8 mg/dL — ABNORMAL LOW (ref 8.9–10.3)
Chloride: 94 mmol/L — ABNORMAL LOW (ref 98–111)
Creatinine, Ser: 0.82 mg/dL (ref 0.61–1.24)
GFR calc Af Amer: >60 mL/min (ref >60)
GFR calc non Af Amer: >60 mL/min (ref >60)
Glucose, Bld: 238 mg/dL — ABNORMAL HIGH (ref 70–99)
Potassium: 3.2 mmol/L — ABNORMAL LOW (ref 3.5–5.1)
Sodium: 127 mmol/L — ABNORMAL LOW (ref 135–145)
Total Bilirubin: 1 mg/dL (ref 0.3–1.2)
Total Protein: 5.7 g/dL — ABNORMAL LOW (ref 6.5–8.1)

## 2019-04-01 LAB — HEPATITIS B SURFACE ANTIGEN: Hepatitis B Surface Ag: NEGATIVE

## 2019-04-01 LAB — CBC
HCT: 31.6 % — ABNORMAL LOW (ref 39.0–52.0)
Hemoglobin: 10.8 g/dL — ABNORMAL LOW (ref 13.0–17.0)
MCH: 30.1 pg (ref 26.0–34.0)
MCHC: 34.2 g/dL (ref 30.0–36.0)
MCV: 88 fL (ref 80.0–100.0)
Platelets: 328 10*3/uL (ref 150–400)
RBC: 3.59 MIL/uL — ABNORMAL LOW (ref 4.22–5.81)
RDW: 11.5 % (ref 11.5–15.5)
WBC: 7.8 10*3/uL (ref 4.0–10.5)
nRBC: 0 % (ref 0.0–0.2)

## 2019-04-01 LAB — GLUCOSE, CAPILLARY
Glucose-Capillary: 233 mg/dL — ABNORMAL HIGH (ref 70–99)
Glucose-Capillary: 320 mg/dL — ABNORMAL HIGH (ref 70–99)
Glucose-Capillary: 270 mg/dL — ABNORMAL HIGH (ref 70–99)
Glucose-Capillary: 275 mg/dL — ABNORMAL HIGH (ref 70–99)

## 2019-04-01 LAB — HEPATITIS C ANTIBODY: HCV Ab: <0.1 s/co ratio (ref 0.0–0.9)

## 2019-04-01 LAB — HIV ANTIBODY (ROUTINE TESTING W REFLEX): HIV Screen 4th Generation wRfx: NONREACTIVE

## 2019-04-01 LAB — HEPATITIS B E ANTIGEN: Hep B E Ag: NEGATIVE

## 2019-04-01 LAB — HEPATITIS B SURFACE ANTIBODY,QUALITATIVE: Hep B S Ab: REACTIVE

## 2019-04-01 LAB — HEPATITIS B CORE ANTIBODY, TOTAL: Hep B Core Total Ab: NEGATIVE

## 2019-04-01 MED ORDER — INSULIN ASPART 100 UNIT/ML ~~LOC~~ SOLN
0.0000 [IU] | Freq: Three times a day (TID) | SUBCUTANEOUS | Status: DC
  Administered 2019-04-02: 18:00:00 5 [IU] via SUBCUTANEOUS
  Administered 2019-04-02: 3 [IU] via SUBCUTANEOUS
  Administered 2019-04-02: 12:00:00 8 [IU] via SUBCUTANEOUS
  Administered 2019-04-03 (×2): 5 [IU] via SUBCUTANEOUS
  Administered 2019-04-03: 3 [IU] via SUBCUTANEOUS
  Administered 2019-04-04: 18:00:00 8 [IU] via SUBCUTANEOUS
  Administered 2019-04-04: 09:00:00 3 [IU] via SUBCUTANEOUS
  Administered 2019-04-04: 13:00:00 5 [IU] via SUBCUTANEOUS
  Administered 2019-04-05: 3 [IU] via SUBCUTANEOUS
  Administered 2019-04-05: 8 [IU] via SUBCUTANEOUS

## 2019-04-01 MED ORDER — LACTATED RINGERS IV SOLN
INTRAVENOUS | Status: DC
  Administered 2019-04-01 – 2019-04-03 (×4): via INTRAVENOUS

## 2019-04-01 MED ORDER — POTASSIUM CHLORIDE CRYS ER 20 MEQ PO TBCR
40.0000 meq | EXTENDED_RELEASE_TABLET | Freq: Two times a day (BID) | ORAL | Status: AC
  Administered 2019-04-01 (×2): 40 meq via ORAL
  Filled 2019-04-01 (×2): qty 2

## 2019-04-01 MED ORDER — INSULIN GLARGINE 100 UNIT/ML ~~LOC~~ SOLN
8.0000 [IU] | Freq: Every day | SUBCUTANEOUS | Status: DC
  Administered 2019-04-02: 8 [IU] via SUBCUTANEOUS
  Filled 2019-04-01 (×3): qty 0.08

## 2019-04-01 MED ORDER — INSULIN ASPART 100 UNIT/ML ~~LOC~~ SOLN
0.0000 [IU] | Freq: Every day | SUBCUTANEOUS | Status: DC
  Administered 2019-04-01: 3 [IU] via SUBCUTANEOUS
  Administered 2019-04-02: 22:00:00 2 [IU] via SUBCUTANEOUS
  Administered 2019-04-03: 3 [IU] via SUBCUTANEOUS
  Administered 2019-04-04: 2 [IU] via SUBCUTANEOUS

## 2019-04-01 NOTE — Progress Notes (Signed)
Subjective:   Gary Bradshaw denies any pain at this time. He does not sleeping was difficult due to nightmares. He has tried melatonin before without significant relief.   He denies a history of urinary symptoms, including hematuria, dysuria, as well as pain with bowel movements. He does note a hx of constipation.   Objective:  Vital signs in last 24 hours: Vitals:   03/31/19 1618 03/31/19 2126 04/01/19 0519 04/01/19 1717  BP: 115/88 113/77 118/78   Pulse: 85 (!) 109    Resp:  12 (!) 22   Temp: 98.1 F (36.7 C) 99.7 F (37.6 C) (!) 101.3 F (38.5 C) (!) 100.6 F (38.1 C)  TempSrc: Oral Oral Oral Oral  SpO2: 100% 98% 97%   Weight:      Height:       Physical Exam Vitals signs and nursing note reviewed.  Constitutional:      General: He is not in acute distress.    Appearance: He is normal weight. He is not toxic-appearing.  HENT:     Head: Normocephalic and atraumatic.  Cardiovascular:     Rate and Rhythm: Normal rate and regular rhythm.     Heart sounds: No murmur. No gallop.   Pulmonary:     Effort: Pulmonary effort is normal. No respiratory distress.     Breath sounds: Normal breath sounds. No wheezing or rales.  Abdominal:     General: Bowel sounds are normal. There is no distension.     Palpations: Abdomen is soft.     Tenderness: There is abdominal tenderness (Surrounding drain placment). There is no guarding.  Musculoskeletal:     Right lower leg: No edema.     Left lower leg: No edema.  Skin:    General: Skin is warm and dry.  Neurological:     General: No focal deficit present.     Mental Status: He is alert and oriented to person, place, and time. Mental status is at baseline.  Psychiatric:        Mood and Affect: Mood normal.        Behavior: Behavior normal.      Assessment/Plan:  Principal Problem:   Bacteremia due to Klebsiella pneumoniae Active Problems:   Liver mass, right lobe  #Hepatic Abscess Patient presented to the ED with complaints  of abdominal pain in addition to shortness of breath, fever, body aches.  CT showed a large hepatic abscess with ultimate loculated septations.  IR was consulted and a drain was placed.  During the procedure they were able to remove 45 mL of purulent material.  Today his drain had scant output with some particles.  Output since placement has been 290 cc.  Gram stain showed gram-negative rods and cultures returned with Klebsiella. Continue with Rocephin and Flagyl at this time.  Per up-to-date, recommended antibiotic timeline is 4 to 6 weeks total.  Given that he is also growing Klebsiella in his urine and blood cultures, this could be caused by seeding.  However, given his size of liver abscess it is unlikely this was an acute process.  IR will follow and we appreciate their recommendations.  -Continue to monitor for febrile events -Continue to monitor vital signs -Rocephin 2 g daily IV -Flagyl 500 mg IV every 8 hours   #Klebsiella Bacteremia #Klebsiella Pyelonephritis Cultures returned today positive for Klebsiella.  Appropriate antibiotics were started yesterday and will be continued today.  Patient denies any urinary symptoms before or during hospitalization.  For bacteremia only,  antibiotics are only needed for up to 14 days however he will have current antibiotic regimen for at least 4 weeks given hepatic abscess.  No repeat blood cultures required at this time.  -Rocephin 2 g daily IV -Flagyl 500 mg IV every 8 hours   #Prostate enlargement, per CT Patient denies any urinary symptoms.  Did not have opportunity to ask about STDs today but will tomorrow.  Concern for prostatitis but unusual to be asymptomatic.  -Chlamydia and gonorrhea test pending   #Diabetes, unknown type Patient initially did not mention knowing he had diabetes, however today during conversation he said he did know he had high sugars.  In the outpatient setting, he will need further work-up to determine if this is  type I or type II.  He will likely need to be discharged with insulin since type is unknown though.  We discussed this today and he is okay with it  -Sliding scale insulin, increased intensity today -Added Lantus 8 units daily     Dispo: Anticipated discharge pending medical improvement.   Gary Bradshaw Internal Medicine PGY-1  Pager: [phone] 04/01/2019, 6:20 PM

## 2019-04-01 NOTE — Progress Notes (Signed)
Referring Physician(s): Dr. Tomi Bamberger  Supervising Physician: Arne Cleveland  Patient Status:  [facility] - In-pt  Chief Complaint: Follow up hepatic abscess drain placed 7/31 by Dr. Vernard Gambles  Subjective:  Patient laying in bed watching TV on exam. He tells me he has been sweaty and thinks he has a fever. His abdominal pain has improved except over the drain insertion site. He has been able to eat, drink and urinate - he has not had a bowel movement yet.   Allergies: Patient has no known allergies.  Medications: Prior to Admission medications   Not on File     Vital Signs: BP 118/78 (BP Location: Left Arm)   Pulse (!) 109   Temp (!) 101.3 F (38.5 C) (Oral)   Resp (!) 22   Ht 5\' 8"  (1.727 m)   Wt 150 lb (68 kg)   SpO2 97%   BMI 22.81 kg/m   Physical Exam Vitals signs reviewed.  Constitutional:      General: He is not in acute distress.    Appearance: He is diaphoretic.  HENT:     Head: Normocephalic.  Cardiovascular:     Rate and Rhythm: Tachycardia present.  Pulmonary:     Effort: Pulmonary effort is normal.  Abdominal:     General: Bowel sounds are normal. There is distension (mild).     Palpations: Abdomen is soft.     Tenderness: There is abdominal tenderness (Over RUQ drain insertion site). There is no guarding.     Comments: (+) RUQ drain to gravity with scant serous output with some purulent debris. Patient reports bag was emptied earlier this morning. Insertion site clean, dry, dressed appropriately, suture and stat lock in tact. No drainage or bleeding noted.  Skin:    General: Skin is warm.  Neurological:     Mental Status: He is alert. Mental status is at baseline.  Psychiatric:        Mood and Affect: Mood normal.        Behavior: Behavior normal.        Thought Content: Thought content normal.        Judgment: Judgment normal.     Imaging: Ct Angio Chest Pe W And/or Wo Contrast  Result Date: 03/31/2019 CLINICAL DATA:  38 year old male with  shortness of breath, fever, abdominal pain and nausea for 2 weeks. EXAM: CT ANGIOGRAPHY CHEST CT ABDOMEN AND PELVIS WITH CONTRAST TECHNIQUE: Multidetector CT imaging of the chest was performed using the standard protocol during bolus administration of intravenous contrast. Multiplanar CT image reconstructions and MIPs were obtained to evaluate the vascular anatomy. Multidetector CT imaging of the abdomen and pelvis was performed using the standard protocol during bolus administration of intravenous contrast. CONTRAST:  121mL OMNIPAQUE IOHEXOL 350 MG/ML SOLN COMPARISON:  None. FINDINGS: CTA CHEST FINDINGS Cardiovascular: This is a technically satisfactory study. There is no evidence of pulmonary emboli. Heart size normal. No evidence of thoracic aortic aneurysm or dissection. No pericardial effusion. Mediastinum/Nodes: No enlarged mediastinal, hilar, or axillary lymph nodes. Thyroid gland, trachea, and esophagus demonstrate no significant findings. Lungs/Pleura: An 8 mm noncalcified nodule at the LEFT LOWER lobe base is identified (Series 8: Image 71). The lungs are otherwise clear. No airspace disease, consolidation, mass, pleural effusion or pneumothorax. Musculoskeletal: No acute or suspicious bony abnormalities identified. Review of the MIP images confirms the above findings. CT ABDOMEN and PELVIS FINDINGS Hepatobiliary: A 6 x 9 x 8 cm cystic lesion within the RIGHT liver contains multiple septations. This  is nonspecific but is suspicious for abscess given patient's symptoms. The remainder of the liver and gallbladder are unremarkable. No biliary dilatation. Pancreas: Unremarkable Spleen: Unremarkable Adrenals/Urinary Tract: Subtle wedge-shaped areas of differential attenuation within the kidneys are noted and could represent pyelonephritis. No other renal abnormalities identified. The adrenal glands and bladder are unremarkable. Stomach/Bowel: Stomach is within normal limits. No evidence of bowel wall  thickening, distention, or inflammatory changes. Vascular/Lymphatic: No significant vascular findings are present. No enlarged abdominal or pelvic lymph nodes. Reproductive: Heterogeneous enlargement of the RIGHT prostate. Other: No ascites, pneumoperitoneum or abdominal wall hernia. Musculoskeletal: No acute or suspicious bony abnormalities. Review of the MIP images confirms the above findings. IMPRESSION: 1. 6 x 9 x 8 cm RIGHT hepatic complex cystic lesion, likely representing an abscess given patient's clinical history. 2. Subtle wedge-shaped areas of differential perfusion within both kidneys which could represent pyelonephritis. No evidence of renal abscess. 3. Nonspecific heterogeneous enlargement of the RIGHT prostate. Correlate clinically. 4. 8 mm LEFT LOWER lobe pulmonary nodule. Non-contrast chest CT at 6-12 months is recommended. If the nodule is stable at time of repeat CT, then future CT at 18-24 months (from today's scan) is considered optional for low-risk patients, but is recommended for high-risk patients. This recommendation follows the consensus statement: Guidelines for Management of Incidental Pulmonary Nodules Detected on CT Images: From the Fleischner Society 2017; Radiology 2017; 284:228-243. Electronically Signed   By: Harmon PierJeffrey  Hu M.D.   On: 03/31/2019 09:37   Ct Abdomen Pelvis W Contrast  Result Date: 03/31/2019 CLINICAL DATA:  38 year old male with shortness of breath, fever, abdominal pain and nausea for 2 weeks. EXAM: CT ANGIOGRAPHY CHEST CT ABDOMEN AND PELVIS WITH CONTRAST TECHNIQUE: Multidetector CT imaging of the chest was performed using the standard protocol during bolus administration of intravenous contrast. Multiplanar CT image reconstructions and MIPs were obtained to evaluate the vascular anatomy. Multidetector CT imaging of the abdomen and pelvis was performed using the standard protocol during bolus administration of intravenous contrast. CONTRAST:  100mL OMNIPAQUE  IOHEXOL 350 MG/ML SOLN COMPARISON:  None. FINDINGS: CTA CHEST FINDINGS Cardiovascular: This is a technically satisfactory study. There is no evidence of pulmonary emboli. Heart size normal. No evidence of thoracic aortic aneurysm or dissection. No pericardial effusion. Mediastinum/Nodes: No enlarged mediastinal, hilar, or axillary lymph nodes. Thyroid gland, trachea, and esophagus demonstrate no significant findings. Lungs/Pleura: An 8 mm noncalcified nodule at the LEFT LOWER lobe base is identified (Series 8: Image 71). The lungs are otherwise clear. No airspace disease, consolidation, mass, pleural effusion or pneumothorax. Musculoskeletal: No acute or suspicious bony abnormalities identified. Review of the MIP images confirms the above findings. CT ABDOMEN and PELVIS FINDINGS Hepatobiliary: A 6 x 9 x 8 cm cystic lesion within the RIGHT liver contains multiple septations. This is nonspecific but is suspicious for abscess given patient's symptoms. The remainder of the liver and gallbladder are unremarkable. No biliary dilatation. Pancreas: Unremarkable Spleen: Unremarkable Adrenals/Urinary Tract: Subtle wedge-shaped areas of differential attenuation within the kidneys are noted and could represent pyelonephritis. No other renal abnormalities identified. The adrenal glands and bladder are unremarkable. Stomach/Bowel: Stomach is within normal limits. No evidence of bowel wall thickening, distention, or inflammatory changes. Vascular/Lymphatic: No significant vascular findings are present. No enlarged abdominal or pelvic lymph nodes. Reproductive: Heterogeneous enlargement of the RIGHT prostate. Other: No ascites, pneumoperitoneum or abdominal wall hernia. Musculoskeletal: No acute or suspicious bony abnormalities. Review of the MIP images confirms the above findings. IMPRESSION: 1. 6 x 9 x 8  cm RIGHT hepatic complex cystic lesion, likely representing an abscess given patient's clinical history. 2. Subtle wedge-shaped  areas of differential perfusion within both kidneys which could represent pyelonephritis. No evidence of renal abscess. 3. Nonspecific heterogeneous enlargement of the RIGHT prostate. Correlate clinically. 4. 8 mm LEFT LOWER lobe pulmonary nodule. Non-contrast chest CT at 6-12 months is recommended. If the nodule is stable at time of repeat CT, then future CT at 18-24 months (from today's scan) is considered optional for low-risk patients, but is recommended for high-risk patients. This recommendation follows the consensus statement: Guidelines for Management of Incidental Pulmonary Nodules Detected on CT Images: From the Fleischner Society 2017; Radiology 2017; 284:228-243. Electronically Signed   By: Harmon PierJeffrey  Hu M.D.   On: 03/31/2019 09:37   Dg Chest Portable 1 View  Result Date: 03/31/2019 CLINICAL DATA:  Shortness of breath EXAM: PORTABLE CHEST 1 VIEW COMPARISON:  None. FINDINGS: Artifact from EKG leads. Normal heart size and mediastinal contours. No acute infiltrate or edema. No effusion or pneumothorax. No acute osseous findings. IMPRESSION: No active disease. Electronically Signed   By: Marnee SpringJonathon  Watts M.D.   On: 03/31/2019 05:08   Ct Image Guided Drainage By Percutaneous Catheter  Result Date: 03/31/2019 CLINICAL DATA:  Fever, hepatic abscess EXAM: CT AND ULTRASOUND GUIDED DRAINAGE OF HEPATIC ABSCESS ANESTHESIA/SEDATION: Intravenous Fentanyl 2mcg and Versed 100mg  were administered as conscious sedation during continuous monitoring of the patient's level of consciousness and physiological / cardiorespiratory status by the radiology RN, with a total moderate sedation time of 21 minutes. PROCEDURE: The procedure, risks, benefits, and alternatives were explained to the patient. Questions regarding the procedure were encouraged and answered. The patient understands and consents to the procedure. Limited axial scans through the liver obtained and the collection was again localized. The lesion could also be  identified on ultrasound which allowed an advantageous subcostal approach. The operative field was prepped with chlorhexidinein a sterile fashion, and a sterile drape was applied covering the operative field. A sterile gown and sterile gloves were used for the procedure. Local anesthesia was provided with 1% Lidocaine. Under real-time ultrasound guidance, 18 gauge trocar needle advanced into the collection under ultrasound from a subcostal approach. Amplatz guidewire advanced easily within the collection. CT confirmed appropriate positioning. Tract dilated to facilitate placement of a 12 French pigtail drain catheter, formed centrally within the collection. 45 mL purulent material were aspirated, sample sent for Gram stain and culture. Catheter was secured externally with 0 Prolene suture and StatLock and placed to gravity drainage. The patient tolerated the procedure well. COMPLICATIONS: None immediate FINDINGS: Complex segment 8 hepatic abscess was again localized. 12 French pigtail drain catheter placed as above. 45 mL purulent material aspirated, sample sent for Gram stain and culture. IMPRESSION: 1. Technically successful CT and ultrasound-guided hepatic abscess drain catheter placement Electronically Signed   By: Corlis Leak  Hassell M.D.   On: 03/31/2019 16:08    Labs:  CBC: Recent Labs    03/31/19 0530 04/01/19 0240  WBC 8.5 7.8  7.8  HGB 12.0* 10.8*  10.8*  HCT 35.2* 31.5*  31.6*  PLT 334 336  328    COAGS: Recent Labs    03/31/19 1153  INR 1.1    BMP: Recent Labs    03/31/19 0530 03/31/19 1153 04/01/19 0240  NA 126* 132* 127*  K 3.4* 3.5 3.2*  CL 91* 96* 94*  CO2 19* 21* 19*  GLUCOSE 285* 255* 238*  BUN 13 14 12   CALCIUM 8.2* 8.1* 7.8*  CREATININE 0.87 0.85 0.82  GFRNONAA >60 >60 >60  GFRAA >60 >60 >60    LIVER FUNCTION TESTS: Recent Labs    03/31/19 0530 04/01/19 0240  BILITOT 1.9* 1.0  AST 38 37  ALT 56* 49*  ALKPHOS 405* 305*  PROT 6.6 5.7*  ALBUMIN 2.5* 2.0*     Assessment and Plan:  38 y/o M s/p hepatic abscess drain placement yesterday in IR. Patient reports feeling febrile, does appear diaphoretic on my exam. Is tolerating PO intake well. Having some pain over drain insertion site which is to be expected - remainder of abdomen is not tender to palpation.   Drain to gravity with scant OP on my exam today, per I/O 290 cc output since placement. Tmax 101.3, slightly tachycardic on exam. WBC 7.8, hgb 10.8, plt 336. He continues on Flagyl + Rocephin per primary team.  Continue current drain management. IR will continue to follow.  Electronically Signed: Villa HerbShannon A Watterson, PA-C 04/01/2019, 3:34 PM   I spent a total of 15 Minutes at the the patient's bedside AND on the patient's hospital floor or unit, greater than 50% of which was counseling/coordinating care for hepatic abscess drain follow up.

## 2019-04-01 NOTE — Progress Notes (Signed)
PHARMACY - PHYSICIAN COMMUNICATION CRITICAL VALUE ALERT - BLOOD CULTURE IDENTIFICATION (BCID)  Gary Bradshaw is an 38 y.o. male who presented to [facility] on 03/31/2019   Assessment:  1/2 kleb pna on bcid  Name of physician (or Provider) ContactedCharleen Kirks, MD  Current antibiotics: Ceftriaxone Flagyl  Changes to prescribed antibiotics recommended:  Continue  Results for orders placed or performed during the hospital encounter of 03/31/19  Blood Culture ID Panel (Reflexed) (Collected: 03/31/2019  4:53 PM)  Result Value Ref Range   Enterococcus species NOT DETECTED NOT DETECTED   Listeria monocytogenes NOT DETECTED NOT DETECTED   Staphylococcus species NOT DETECTED NOT DETECTED   Staphylococcus aureus (BCID) NOT DETECTED NOT DETECTED   Streptococcus species NOT DETECTED NOT DETECTED   Streptococcus agalactiae NOT DETECTED NOT DETECTED   Streptococcus pneumoniae NOT DETECTED NOT DETECTED   Streptococcus pyogenes NOT DETECTED NOT DETECTED   Acinetobacter baumannii NOT DETECTED NOT DETECTED   Enterobacteriaceae species DETECTED (A) NOT DETECTED   Enterobacter cloacae complex NOT DETECTED NOT DETECTED   Escherichia coli NOT DETECTED NOT DETECTED   Klebsiella oxytoca NOT DETECTED NOT DETECTED   Klebsiella pneumoniae DETECTED (A) NOT DETECTED   Proteus species NOT DETECTED NOT DETECTED   Serratia marcescens NOT DETECTED NOT DETECTED   Carbapenem resistance NOT DETECTED NOT DETECTED   Haemophilus influenzae NOT DETECTED NOT DETECTED   Neisseria meningitidis NOT DETECTED NOT DETECTED   Pseudomonas aeruginosa NOT DETECTED NOT DETECTED   Candida albicans NOT DETECTED NOT DETECTED   Candida glabrata NOT DETECTED NOT DETECTED   Candida krusei NOT DETECTED NOT DETECTED   Candida parapsilosis NOT DETECTED NOT DETECTED   Candida tropicalis NOT DETECTED NOT DETECTED   Levester Fresh, PharmD, BCPS, BCCCP Clinical Pharmacist [phone]  Please check AMION for all [facility] numbers  04/01/2019  11:11 AM

## 2019-04-02 LAB — GLUCOSE, CAPILLARY
Glucose-Capillary: 171 mg/dL — ABNORMAL HIGH (ref 70–99)
Glucose-Capillary: 285 mg/dL — ABNORMAL HIGH (ref 70–99)
Glucose-Capillary: 245 mg/dL — ABNORMAL HIGH (ref 70–99)
Glucose-Capillary: 237 mg/dL — ABNORMAL HIGH (ref 70–99)

## 2019-04-02 LAB — BASIC METABOLIC PANEL
Anion gap: 11 (ref 5–15)
BUN: 11 mg/dL (ref 6–20)
CO2: 24 mmol/L (ref 22–32)
Calcium: 8 mg/dL — ABNORMAL LOW (ref 8.9–10.3)
Chloride: 100 mmol/L (ref 98–111)
Creatinine, Ser: 0.75 mg/dL (ref 0.61–1.24)
GFR calc Af Amer: >60 mL/min (ref >60)
GFR calc non Af Amer: >60 mL/min (ref >60)
Glucose, Bld: 207 mg/dL — ABNORMAL HIGH (ref 70–99)
Potassium: 3.5 mmol/L (ref 3.5–5.1)
Sodium: 135 mmol/L (ref 135–145)

## 2019-04-02 LAB — CBC
HCT: 30.8 % — ABNORMAL LOW (ref 39.0–52.0)
Hemoglobin: 10.5 g/dL — ABNORMAL LOW (ref 13.0–17.0)
MCH: 29.9 pg (ref 26.0–34.0)
MCHC: 34.1 g/dL (ref 30.0–36.0)
MCV: 87.7 fL (ref 80.0–100.0)
Platelets: 349 10*3/uL (ref 150–400)
RBC: 3.51 MIL/uL — ABNORMAL LOW (ref 4.22–5.81)
RDW: 11.6 % (ref 11.5–15.5)
WBC: 11 10*3/uL — ABNORMAL HIGH (ref 4.0–10.5)
nRBC: 0 % (ref 0.0–0.2)

## 2019-04-02 LAB — URINE CULTURE: Culture: >=100000 — AB

## 2019-04-02 NOTE — Progress Notes (Signed)
Internal Medicine Attending:   I saw and examined the patient. I reviewed the resident's note and I agree with the resident's findings and plan as documented in the resident's note.  Patient states that he still feels feverish today but otherwise has no complaints including urinary symptoms.  Patient was initially made to the hospital with abdominal pain shortness of breath and was found to have a liver abscess.  Patient's fever curve slowly improving.  His blood cultures and urine culture as well as culture from hepatic abscess are all positive for Klebsiella pneumonia.  We will continue ceftriaxone 2 g every 24 hours for this.  I suspect that the etiology of his infection is urinary in nature.  He was noted to have heterogeneous enlargement of the right lobe of his prostate as well as possible pyelonephritis on CT.  Will obtain urology consult for further evaluation of this.  Would DC metronidazole.

## 2019-04-02 NOTE — Progress Notes (Signed)
Subjective:   Gary Bradshaw states that he still feels feverish today.  Otherwise no other complaints at this time.  I asked again about urinary symptoms and he again denied any dysuria, hematuria.  He also denies exposure to STDs and states he is monogamous.   Objective:  Vital signs in last 24 hours: Vitals:   04/02/19 0149 04/02/19 0348 04/02/19 0601 04/02/19 1034  BP: 116/82  107/79   Pulse: (!) 106  92   Resp: 18  16   Temp: (!) 102 F (38.9 C) 98.9 F (37.2 C) 98.3 F (36.8 C) 99.7 F (37.6 C)  TempSrc: Oral Oral Oral Oral  SpO2: 98%  99%   Weight:   68.7 kg   Height:       Physical Exam Vitals signs and nursing note reviewed.  Constitutional:      General: He is not in acute distress.    Appearance: He is normal weight. He is not ill-appearing, toxic-appearing or diaphoretic.  HENT:     Head: Normocephalic and atraumatic.  Cardiovascular:     Rate and Rhythm: Normal rate and regular rhythm.  Pulmonary:     Effort: Pulmonary effort is normal. No respiratory distress.  Abdominal:     General: There is no distension.     Palpations: Abdomen is soft.     Tenderness: There is abdominal tenderness (RUQ surrounding drain. No other tenderness).  Musculoskeletal:     Right lower leg: No edema.     Left lower leg: No edema.  Skin:    General: Skin is warm and dry.  Neurological:     General: No focal deficit present.     Mental Status: He is alert and oriented to person, place, and time. Mental status is at baseline.  Psychiatric:        Mood and Affect: Mood normal.        Behavior: Behavior normal.     Assessment/Plan:  Principal Problem:   Bacteremia due to Klebsiella pneumoniae Active Problems:   Liver mass, right lobe  #Hepatic Abscess, culture growing Klebsiella #Klebsiella Bacteremia #Klebsiella Pyelonephritis Patient continues to have febrile events overnight, with maximum temp of 102.  When asked again about urinary symptoms, he states he is sure he  never had any.  It would make sense for initial action to be in the urethra, which could have spread to cause acute prostatitis and pyelonephritis.  Without proper treatment, this could have caused his bacteremia and hence hepatic abscess.  Today his labs showed a jump in white blood count to 11.0.  This is unusual considering his white blood count did not even increase on day drain was placed.  We will continue to trend.  Susceptibility testing has shown ceftriaxone is adequate.  Will consult urology tomorrow to see what their take is on this.  IR on board and we appreciate their recommendations.  -Continue to monitor for febrile events -Continue to monitor vital signs -Rocephin 2 g daily IV -Flagyl 500 mg IV every 8 hours -Consult urology tomorrow.   #Prostate enlargement, per CT Patient continues to deny any urinary symptoms. He also denies any exposure to STDs, as far as he knows.   Given his growth of Klebsiella in the UA, acute prostatitis could be the cause of his bacteremia, leading to hepatic abscess. Plan to consult with urology tomorrow.  -Chlamydia and gonorrhea test pending -Consult urology tomorrow a.m.   #Diabetes, unknown type Sugars are beginning to improve, with BG 170 this  morning.  His electrolytes have also begun to improve, likely secondary to better glycemic control.  -Sliding scale insulin -Lantus 8 units daily     Dispo: Anticipated discharge pending medical improvement.   Dr. Verdene LennertIulia Daysie Helf Internal Medicine PGY-1  Pager: [phone] 04/02/2019, 1:02 PM

## 2019-04-03 LAB — COMPREHENSIVE METABOLIC PANEL
ALT: 29 U/L (ref 0–44)
AST: 15 U/L (ref 15–41)
Albumin: 1.9 g/dL — ABNORMAL LOW (ref 3.5–5.0)
Alkaline Phosphatase: 243 U/L — ABNORMAL HIGH (ref 38–126)
Anion gap: 13 (ref 5–15)
BUN: 12 mg/dL (ref 6–20)
CO2: 23 mmol/L (ref 22–32)
Calcium: 7.9 mg/dL — ABNORMAL LOW (ref 8.9–10.3)
Chloride: 96 mmol/L — ABNORMAL LOW (ref 98–111)
Creatinine, Ser: 0.68 mg/dL (ref 0.61–1.24)
GFR calc Af Amer: >60 mL/min (ref >60)
GFR calc non Af Amer: >60 mL/min (ref >60)
Glucose, Bld: 218 mg/dL — ABNORMAL HIGH (ref 70–99)
Potassium: 3.7 mmol/L (ref 3.5–5.1)
Sodium: 132 mmol/L — ABNORMAL LOW (ref 135–145)
Total Bilirubin: 0.6 mg/dL (ref 0.3–1.2)
Total Protein: 5.6 g/dL — ABNORMAL LOW (ref 6.5–8.1)

## 2019-04-03 LAB — CULTURE, BLOOD (ROUTINE X 2): Special Requests: ADEQUATE

## 2019-04-03 LAB — CBC WITH DIFFERENTIAL/PLATELET
Abs Immature Granulocytes: 0.08 10*3/uL — ABNORMAL HIGH (ref 0.00–0.07)
Basophils Absolute: 0 10*3/uL (ref 0.0–0.1)
Basophils Relative: 0 %
Eosinophils Absolute: 0 10*3/uL (ref 0.0–0.5)
Eosinophils Relative: 0 %
HCT: 32.3 % — ABNORMAL LOW (ref 39.0–52.0)
Hemoglobin: 10.6 g/dL — ABNORMAL LOW (ref 13.0–17.0)
Immature Granulocytes: 1 %
Lymphocytes Relative: 9 %
Lymphs Abs: 0.8 10*3/uL (ref 0.7–4.0)
MCH: 29.3 pg (ref 26.0–34.0)
MCHC: 32.8 g/dL (ref 30.0–36.0)
MCV: 89.2 fL (ref 80.0–100.0)
Monocytes Absolute: 0.6 10*3/uL (ref 0.1–1.0)
Monocytes Relative: 6 %
Neutro Abs: 8 10*3/uL — ABNORMAL HIGH (ref 1.7–7.7)
Neutrophils Relative %: 84 %
Platelets: 399 10*3/uL (ref 150–400)
RBC: 3.62 MIL/uL — ABNORMAL LOW (ref 4.22–5.81)
RDW: 11.5 % (ref 11.5–15.5)
WBC: 9.5 10*3/uL (ref 4.0–10.5)
nRBC: 0 % (ref 0.0–0.2)

## 2019-04-03 LAB — GLUCOSE, CAPILLARY
Glucose-Capillary: 173 mg/dL — ABNORMAL HIGH (ref 70–99)
Glucose-Capillary: 231 mg/dL — ABNORMAL HIGH (ref 70–99)
Glucose-Capillary: 242 mg/dL — ABNORMAL HIGH (ref 70–99)
Glucose-Capillary: 256 mg/dL — ABNORMAL HIGH (ref 70–99)

## 2019-04-03 LAB — E. HISTOLYTICA ANTIBODY (AMOEBA AB): E histolytica Ab: NEGATIVE

## 2019-04-03 LAB — HEPATITIS B E ANTIBODY: Hep B E Ab: NEGATIVE

## 2019-04-03 MED ORDER — INSULIN GLARGINE 100 UNIT/ML ~~LOC~~ SOLN
10.0000 [IU] | Freq: Every day | SUBCUTANEOUS | Status: DC
  Administered 2019-04-03 – 2019-04-04 (×2): 10 [IU] via SUBCUTANEOUS
  Filled 2019-04-03 (×2): qty 0.1

## 2019-04-03 MED ORDER — LIVING WELL WITH DIABETES BOOK
Freq: Once | Status: AC
  Administered 2019-04-03: 19:00:00
  Filled 2019-04-03: qty 1

## 2019-04-03 NOTE — Progress Notes (Signed)
   Subjective: Patient feeling well today. He states that he has not slept long due to nightmares. He is a little uncomfortable. He states that he does have difficulty emptying his bladder and feels that he is urinary frequency. We discussed that the sensitivities of his cultures have come back and he is on the appropriate antibiotics. We discussed that you will need an extended course for his current infection. He voices understanding. All questions and concerns addressed.  Objective: Vital signs in last 24 hours: Vitals:   04/03/19 0142 04/03/19 0500 04/03/19 0504 04/03/19 0851  BP:   117/89   Pulse:   91   Resp:      Temp: 99.4 F (37.4 C)  99.1 F (37.3 C) 98.8 F (37.1 C)  TempSrc: Oral   Oral  SpO2:   98%   Weight:  60.9 kg    Height:       General: Well nourished male in no acute distress Pulm: Good air movement with no wheezing or crackles  CV: RRR, no murmurs, no rubs  Abdomen: Active bowel sounds, soft, non-distended, no tenderness to palpation   Assessment/Plan:  Gary Bradshaw is a 38 year old male that presented to the emergency department with several weeks of fevers and night sweats. CT abdomen illustrated a 4 cm hepatic abscess. He went IR where approximately 40 mL of purulent drainage was obtained. Blood cultures, abscess cultures, and urine cultures have all grown out Klebsiella pneumonia.  Klebsiella Bacteremia 2/2 Urinary Source Hepatic Abscess  Probable prostatitis  - Fever curve down trending - Flagyl discontinued - Continue ceftriaxone, will need PICC line in 4 to 6 weeks of antibiotics - Counsel urology today.  Diabetes - CBGs above goal of 180  - Increase lantus to 10 units QD - Continue moderate SSI  Dispo: Anticipated discharge in approximately 1-2 day(s).   Ina Homes, MD 04/03/2019, 10:10 AM

## 2019-04-03 NOTE — Progress Notes (Signed)
Referring Physician(s): Dr Tenny CrawN Narendra    Supervising Physician: Gilmer MorWagner, Jaime  [facility]  Chief Complaint:  Hepatic abscess  Subjective:  Abscess drain placement 7/31  Still with soreness at site OP bloody Feels some better  Allergies: Patient has no known allergies.  Medications: Prior to Admission medications   Medication Sig Start Date End Date Taking? Authorizing Provider  acetaminophen (TYLENOL) 500 MG tablet Take 500-1,000 mg by mouth every 6 (six) hours as needed for fever.   Yes [provider]     Vital Signs: BP 117/89 (BP Location: Left Arm)    Pulse 91    Temp 98.3 F (36.8 C) (Oral)    Resp 16    Ht 5\' 8"  (1.727 m)    Wt 134 lb 3.2 oz (60.9 kg)    SpO2 98%    BMI 20.41 kg/m   Physical Exam Skin:    General: Skin is warm and dry.     Comments: Soreness at site Clean and dry OP bloody 40 cc yesterday 20 cc in bag  Culture: MODERATE KLEBSIELLA PNEUMONIAE  NO ANAEROBES ISOLATED; CULTURE IN PROGRESS FOR 5 DAYS  Report Status PENDING  Organism ID, Bacteria: KLEBSIELLA PNEUMONIAE       Imaging: Ct Angio Chest Pe W And/or Wo Contrast  Result Date: 03/31/2019 CLINICAL DATA:  38 year old male with shortness of breath, fever, abdominal pain and nausea for 2 weeks. EXAM: CT ANGIOGRAPHY CHEST CT ABDOMEN AND PELVIS WITH CONTRAST TECHNIQUE: Multidetector CT imaging of the chest was performed using the standard protocol during bolus administration of intravenous contrast. Multiplanar CT image reconstructions and MIPs were obtained to evaluate the vascular anatomy. Multidetector CT imaging of the abdomen and pelvis was performed using the standard protocol during bolus administration of intravenous contrast. CONTRAST:  100mL OMNIPAQUE IOHEXOL 350 MG/ML SOLN COMPARISON:  None. FINDINGS: CTA CHEST FINDINGS Cardiovascular: This is a technically satisfactory study. There is no evidence of pulmonary emboli. Heart size normal. No  evidence of thoracic aortic aneurysm or dissection. No pericardial effusion. Mediastinum/Nodes: No enlarged mediastinal, hilar, or axillary lymph nodes. Thyroid gland, trachea, and esophagus demonstrate no significant findings. Lungs/Pleura: An 8 mm noncalcified nodule at the LEFT LOWER lobe base is identified (Series 8: Image 71). The lungs are otherwise clear. No airspace disease, consolidation, mass, pleural effusion or pneumothorax. Musculoskeletal: No acute or suspicious bony abnormalities identified. Review of the MIP images confirms the above findings. CT ABDOMEN and PELVIS FINDINGS Hepatobiliary: A 6 x 9 x 8 cm cystic lesion within the RIGHT liver contains multiple septations. This is nonspecific but is suspicious for abscess given patient's symptoms. The remainder of the liver and gallbladder are unremarkable. No biliary dilatation. Pancreas: Unremarkable Spleen: Unremarkable Adrenals/Urinary Tract: Subtle wedge-shaped areas of differential attenuation within the kidneys are noted and could represent pyelonephritis. No other renal abnormalities identified. The adrenal glands and bladder are unremarkable. Stomach/Bowel: Stomach is within normal limits. No evidence of bowel wall thickening, distention, or inflammatory changes. Vascular/Lymphatic: No significant vascular findings are present. No enlarged abdominal or pelvic lymph nodes. Reproductive: Heterogeneous enlargement of the RIGHT prostate. Other: No ascites, pneumoperitoneum or abdominal wall hernia. Musculoskeletal: No acute or suspicious bony abnormalities. Review of the MIP images confirms the above findings. IMPRESSION: 1. 6 x 9 x 8 cm RIGHT hepatic complex cystic lesion, likely representing an abscess given patient's clinical history. 2. Subtle wedge-shaped areas of differential perfusion within both kidneys which could represent pyelonephritis. No evidence of renal  abscess. 3. Nonspecific heterogeneous enlargement of the RIGHT prostate.  Correlate clinically. 4. 8 mm LEFT LOWER lobe pulmonary nodule. Non-contrast chest CT at 6-12 months is recommended. If the nodule is stable at time of repeat CT, then future CT at 18-24 months (from today's scan) is considered optional for low-risk patients, but is recommended for high-risk patients. This recommendation follows the consensus statement: Guidelines for Management of Incidental Pulmonary Nodules Detected on CT Images: From the Fleischner Society 2017; Radiology 2017; 284:228-243. Electronically Signed   By: Harmon PierJeffrey  Hu M.D.   On: 03/31/2019 09:37   Ct Abdomen Pelvis W Contrast  Result Date: 03/31/2019 CLINICAL DATA:  38 year old male with shortness of breath, fever, abdominal pain and nausea for 2 weeks. EXAM: CT ANGIOGRAPHY CHEST CT ABDOMEN AND PELVIS WITH CONTRAST TECHNIQUE: Multidetector CT imaging of the chest was performed using the standard protocol during bolus administration of intravenous contrast. Multiplanar CT image reconstructions and MIPs were obtained to evaluate the vascular anatomy. Multidetector CT imaging of the abdomen and pelvis was performed using the standard protocol during bolus administration of intravenous contrast. CONTRAST:  100mL OMNIPAQUE IOHEXOL 350 MG/ML SOLN COMPARISON:  None. FINDINGS: CTA CHEST FINDINGS Cardiovascular: This is a technically satisfactory study. There is no evidence of pulmonary emboli. Heart size normal. No evidence of thoracic aortic aneurysm or dissection. No pericardial effusion. Mediastinum/Nodes: No enlarged mediastinal, hilar, or axillary lymph nodes. Thyroid gland, trachea, and esophagus demonstrate no significant findings. Lungs/Pleura: An 8 mm noncalcified nodule at the LEFT LOWER lobe base is identified (Series 8: Image 71). The lungs are otherwise clear. No airspace disease, consolidation, mass, pleural effusion or pneumothorax. Musculoskeletal: No acute or suspicious bony abnormalities identified. Review of the MIP images confirms the  above findings. CT ABDOMEN and PELVIS FINDINGS Hepatobiliary: A 6 x 9 x 8 cm cystic lesion within the RIGHT liver contains multiple septations. This is nonspecific but is suspicious for abscess given patient's symptoms. The remainder of the liver and gallbladder are unremarkable. No biliary dilatation. Pancreas: Unremarkable Spleen: Unremarkable Adrenals/Urinary Tract: Subtle wedge-shaped areas of differential attenuation within the kidneys are noted and could represent pyelonephritis. No other renal abnormalities identified. The adrenal glands and bladder are unremarkable. Stomach/Bowel: Stomach is within normal limits. No evidence of bowel wall thickening, distention, or inflammatory changes. Vascular/Lymphatic: No significant vascular findings are present. No enlarged abdominal or pelvic lymph nodes. Reproductive: Heterogeneous enlargement of the RIGHT prostate. Other: No ascites, pneumoperitoneum or abdominal wall hernia. Musculoskeletal: No acute or suspicious bony abnormalities. Review of the MIP images confirms the above findings. IMPRESSION: 1. 6 x 9 x 8 cm RIGHT hepatic complex cystic lesion, likely representing an abscess given patient's clinical history. 2. Subtle wedge-shaped areas of differential perfusion within both kidneys which could represent pyelonephritis. No evidence of renal abscess. 3. Nonspecific heterogeneous enlargement of the RIGHT prostate. Correlate clinically. 4. 8 mm LEFT LOWER lobe pulmonary nodule. Non-contrast chest CT at 6-12 months is recommended. If the nodule is stable at time of repeat CT, then future CT at 18-24 months (from today's scan) is considered optional for low-risk patients, but is recommended for high-risk patients. This recommendation follows the consensus statement: Guidelines for Management of Incidental Pulmonary Nodules Detected on CT Images: From the Fleischner Society 2017; Radiology 2017; 284:228-243. Electronically Signed   By: Harmon PierJeffrey  Hu M.D.   On:  03/31/2019 09:37   Dg Chest Portable 1 View  Result Date: 03/31/2019 CLINICAL DATA:  Shortness of breath EXAM: PORTABLE CHEST 1 VIEW COMPARISON:  None.  FINDINGS: Artifact from EKG leads. Normal heart size and mediastinal contours. No acute infiltrate or edema. No effusion or pneumothorax. No acute osseous findings. IMPRESSION: No active disease. Electronically Signed   By: Monte Fantasia M.D.   On: 03/31/2019 05:08   Ct Image Guided Drainage By Percutaneous Catheter  Result Date: 03/31/2019 CLINICAL DATA:  Fever, hepatic abscess EXAM: CT AND ULTRASOUND GUIDED DRAINAGE OF HEPATIC ABSCESS ANESTHESIA/SEDATION: Intravenous Fentanyl 27mcg and Versed 100mg  were administered as conscious sedation during continuous monitoring of the patient's level of consciousness and physiological / cardiorespiratory status by the radiology RN, with a total moderate sedation time of 21 minutes. PROCEDURE: The procedure, risks, benefits, and alternatives were explained to the patient. Questions regarding the procedure were encouraged and answered. The patient understands and consents to the procedure. Limited axial scans through the liver obtained and the collection was again localized. The lesion could also be identified on ultrasound which allowed an advantageous subcostal approach. The operative field was prepped with chlorhexidinein a sterile fashion, and a sterile drape was applied covering the operative field. A sterile gown and sterile gloves were used for the procedure. Local anesthesia was provided with 1% Lidocaine. Under real-time ultrasound guidance, 18 gauge trocar needle advanced into the collection under ultrasound from a subcostal approach. Amplatz guidewire advanced easily within the collection. CT confirmed appropriate positioning. Tract dilated to facilitate placement of a 12 French pigtail drain catheter, formed centrally within the collection. 45 mL purulent material were aspirated, sample sent for Gram stain  and culture. Catheter was secured externally with 0 Prolene suture and StatLock and placed to gravity drainage. The patient tolerated the procedure well. COMPLICATIONS: None immediate FINDINGS: Complex segment 8 hepatic abscess was again localized. 12 French pigtail drain catheter placed as above. 45 mL purulent material aspirated, sample sent for Gram stain and culture. IMPRESSION: 1. Technically successful CT and ultrasound-guided hepatic abscess drain catheter placement Electronically Signed   By: Lucrezia Europe M.D.   On: 03/31/2019 16:08    Labs:  CBC: Recent Labs    03/31/19 0530 04/01/19 0240 04/02/19 0503 04/03/19 0301  WBC 8.5 7.8   7.8 11.0* 9.5  HGB 12.0* 10.8*   10.8* 10.5* 10.6*  HCT 35.2* 31.5*   31.6* 30.8* 32.3*  PLT 334 336   328 349 399    COAGS: Recent Labs    03/31/19 1153  INR 1.1    BMP: Recent Labs    03/31/19 1153 04/01/19 0240 04/02/19 0503 04/03/19 0301  NA 132* 127* 135 132*  K 3.5 3.2* 3.5 3.7  CL 96* 94* 100 96*  CO2 21* 19* 24 23  GLUCOSE 255* 238* 207* 218*  BUN 14 12 11 12   CALCIUM 8.1* 7.8* 8.0* 7.9*  CREATININE 0.85 0.82 0.75 0.68  GFRNONAA >60 >60 >60 >60  GFRAA >60 >60 >60 >60    LIVER FUNCTION TESTS: Recent Labs    03/31/19 0530 04/01/19 0240 04/03/19 0301  BILITOT 1.9* 1.0 0.6  AST 38 37 15  ALT 56* 49* 29  ALKPHOS 405* 305* 243*  PROT 6.6 5.7* 5.6*  ALBUMIN 2.5* 2.0* 1.9*    Assessment and Plan:  Hepatic abscess Draining well Will follow   Electronically Signed: Lavonia Drafts, PA-C 04/03/2019, 2:12 PM   I spent a total of 15 Minutes at the the patient's bedside AND on the patient's hospital floor or unit, greater than 50% of which was counseling/coordinating care for hepatic abscess

## 2019-04-03 NOTE — Progress Notes (Signed)
Checked pt's temp at his request: 98.29F oral. Discussed reason for elevated blood sugars with infection. LR infusing per MAR. Pt reports bowel movement two days ago (8/1), getting Miralax for constipation. Will monitor.

## 2019-04-03 NOTE — TOC Initial Note (Addendum)
Transition of Care [facility]) - Initial/Assessment Note    Patient Details  Name: Gary Bradshaw MRN: [id number] Date of Birth: Jun 29, 1981  [facility]) CM/SW Contact:    Benard Halsted, LCSW Phone Number: 04/03/2019, 3:27 PM  Clinical Narrative:                 CSW received referral regarding home IV antibiotic needs. Patient reported that his girlfriend will be available to assist him at discharge. He does not have a Fish farm manager number or a PCP. CSW making a follow up appointment at [facility] so patient will be able to get medications from their pharmacy. Referral sent to [facility] and will locate charity home health. Will arrange one time-use [place] letter for medications.   8/4: Referral sent to [place] for review for charity services. Internal Medicine in agreement to sign home health orders as needed until PCP can be established at [facility].   Expected Discharge Plan: [place] Barriers to Discharge: Continued Medical Work up, Inadequate or no insurance   Patient Goals and CMS Choice Patient states their goals for this hospitalization and ongoing recovery are:: Return home CMS Medicare.gov Compare Post Acute Care list provided to:: Patient Choice offered to / list presented to : Patient  Expected Discharge Plan and Services Expected Discharge Plan: [place] In-house Referral: NA Discharge Planning Services: CM Consult Post Acute Care Choice: [place] arrangements for the past 2 months: Apartment                 DME Arranged: IV pump/equipment DME Agency: Other - Comment([facility]) Date DME Agency Contacted: 04/03/19 Time DME Agency Contacted: 1527 Representative spoke with at DME Agency: [place]: RN          Prior Living Arrangements/Services Living arrangements for the past 2 months: [place] with:: Self Patient language and need for interpreter  reviewed:: Yes Do you feel safe going back to the place where you live?: Yes      Need for Family Participation in Patient Care: No (Comment) Care giver support system in place?: Yes (comment)   Criminal Activity/Legal Involvement Pertinent to Current Situation/Hospitalization: No - Comment as needed  Activities of Daily Living Home Assistive Devices/Equipment: CBG Meter ADL Screening (condition at time of admission) Patient's cognitive ability adequate to safely complete daily activities?: Yes Is the patient deaf or have difficulty hearing?: No Does the patient have difficulty seeing, even when wearing glasses/contacts?: No Does the patient have difficulty concentrating, remembering, or making decisions?: No Patient able to express need for assistance with ADLs?: No Does the patient have difficulty dressing or bathing?: No Independently performs ADLs?: Yes (appropriate for developmental age) Does the patient have difficulty walking or climbing stairs?: No Weakness of Legs: None Weakness of Arms/Hands: None  Permission Sought/Granted Permission sought to share information with : Investment banker, corporate granted to share info w AGENCY: Home Health        Emotional Assessment Appearance:: Appears stated age Attitude/Demeanor/Rapport: Engaged Affect (typically observed): Accepting, Appropriate, Calm Orientation: : Oriented to Self, Oriented to Place, Oriented to  Time, Oriented to Situation Alcohol / Substance Use: Not Applicable Psych Involvement: No (comment)  Admission diagnosis:  Abscess [L02.91] Hepatic abscess [K75.0] Patient Active Problem List   Diagnosis Date Noted  . Bacteremia due to Klebsiella pneumoniae 04/01/2019  . Liver mass, right lobe 03/31/2019  PCP:  Patient, No Pcp Per Pharmacy:   [id number] [place], [place] - [address] [facility] [address] [place]  [id number] Phone: [phone] Fax: [phone]     Social Determinants of Health (SDOH) Interventions    Readmission Risk Interventions No flowsheet data found.

## 2019-04-03 NOTE — Progress Notes (Signed)
Inpatient Diabetes Program Recommendations  AACE/ADA: New Consensus Statement on Inpatient Glycemic Control (2015)  Target Ranges:  Prepandial:   less than 140 mg/dL      Peak postprandial:   less than 180 mg/dL (1-2 hours)      Critically ill patients:  140 - 180 mg/dL   Lab Results  Component Value Date   GLUCAP 231 (H) 04/03/2019   HGBA1C 8.9 (H) 03/31/2019    Review of Glycemic Control Results for Gary Bradshaw, Gary Bradshaw (MRN [id number]) as of 04/03/2019 14:21  Ref. Range 04/02/2019 11:43 04/02/2019 16:46 04/02/2019 22:14 04/03/2019 07:55 04/03/2019 11:39  Glucose-Capillary Latest Ref Range: 70 - 99 mg/dL 285 (H) 245 (H) 237 (H) 173 (H) 231 (H)   Diabetes history: ? New diagnosis of DM Outpatient Diabetes medications: None Current orders for Inpatient glycemic control:  Lantus 10 units daily, Novolog moderate tid with meals and HS  Inpatient Diabetes Program Recommendations:    Note elevated A1C.  This appears to be new for patient.  Unsure whether patient will need insulin or oral DM medications at d/c.  Ordered LWWD booklet.  Also ordered Care management consult.  Will f/u on 04/04/19.  Sent secure chat to resident to clarify diagnosis and d/c medications.   Thanks,  Adah Perl, RN, BC-ADM Inpatient Diabetes Coordinator Pager [phone] (8a-5p)

## 2019-04-04 ENCOUNTER — Inpatient Hospital Stay: Payer: Self-pay

## 2019-04-04 DIAGNOSIS — Z978 Presence of other specified devices: Secondary | ICD-10-CM

## 2019-04-04 DIAGNOSIS — B961 Klebsiella pneumoniae [K. pneumoniae] as the cause of diseases classified elsewhere: Secondary | ICD-10-CM

## 2019-04-04 DIAGNOSIS — M25571 Pain in right ankle and joints of right foot: Secondary | ICD-10-CM

## 2019-04-04 DIAGNOSIS — E119 Type 2 diabetes mellitus without complications: Secondary | ICD-10-CM

## 2019-04-04 DIAGNOSIS — R7881 Bacteremia: Secondary | ICD-10-CM

## 2019-04-04 DIAGNOSIS — K750 Abscess of liver: Principal | ICD-10-CM

## 2019-04-04 LAB — CBC
HCT: 34.1 % — ABNORMAL LOW (ref 39.0–52.0)
Hemoglobin: 11.4 g/dL — ABNORMAL LOW (ref 13.0–17.0)
MCH: 29.5 pg (ref 26.0–34.0)
MCHC: 33.4 g/dL (ref 30.0–36.0)
MCV: 88.3 fL (ref 80.0–100.0)
Platelets: 507 10*3/uL — ABNORMAL HIGH (ref 150–400)
RBC: 3.86 MIL/uL — ABNORMAL LOW (ref 4.22–5.81)
RDW: 11.6 % (ref 11.5–15.5)
WBC: 8.7 10*3/uL (ref 4.0–10.5)
nRBC: 0 % (ref 0.0–0.2)

## 2019-04-04 LAB — BASIC METABOLIC PANEL
Anion gap: 12 (ref 5–15)
BUN: 9 mg/dL (ref 6–20)
CO2: 25 mmol/L (ref 22–32)
Calcium: 8.3 mg/dL — ABNORMAL LOW (ref 8.9–10.3)
Chloride: 97 mmol/L — ABNORMAL LOW (ref 98–111)
Creatinine, Ser: 0.56 mg/dL — ABNORMAL LOW (ref 0.61–1.24)
GFR calc Af Amer: >60 mL/min (ref >60)
GFR calc non Af Amer: >60 mL/min (ref >60)
Glucose, Bld: 210 mg/dL — ABNORMAL HIGH (ref 70–99)
Potassium: 4 mmol/L (ref 3.5–5.1)
Sodium: 134 mmol/L — ABNORMAL LOW (ref 135–145)

## 2019-04-04 LAB — GLUCOSE, CAPILLARY
Glucose-Capillary: 177 mg/dL — ABNORMAL HIGH (ref 70–99)
Glucose-Capillary: 249 mg/dL — ABNORMAL HIGH (ref 70–99)
Glucose-Capillary: 266 mg/dL — ABNORMAL HIGH (ref 70–99)
Glucose-Capillary: 216 mg/dL — ABNORMAL HIGH (ref 70–99)

## 2019-04-04 LAB — GC/CHLAMYDIA PROBE AMP (~~LOC~~) NOT AT ARMC
Chlamydia: NEGATIVE
Neisseria Gonorrhea: NEGATIVE

## 2019-04-04 MED ORDER — SODIUM CHLORIDE 0.9% FLUSH: 10.0000 mL | INTRAVENOUS | Status: DC | PRN

## 2019-04-04 MED ORDER — INSULIN GLARGINE 100 UNIT/ML SOLOSTAR PEN: 12.0000 [IU] | PEN_INJECTOR | Freq: Every day | SUBCUTANEOUS | 0 refills | Status: DC

## 2019-04-04 MED ORDER — INSULIN GLARGINE 100 UNIT/ML ~~LOC~~ SOLN
12.0000 [IU] | Freq: Every day | SUBCUTANEOUS | Status: DC
  Administered 2019-04-05: 12 [IU] via SUBCUTANEOUS
  Filled 2019-04-04: qty 0.12

## 2019-04-04 MED ORDER — INSULIN STARTER KIT- PEN NEEDLES (ENGLISH)
1.0000 | Freq: Once | Status: AC
  Administered 2019-04-05: 1
  Filled 2019-04-04: qty 1

## 2019-04-04 MED FILL — LANTUS SOLOSTAR 100 UNITS/M: 100 | 32 days supply | Qty: 6 | Fill #0

## 2019-04-04 MED FILL — PENTIPS 31G X 8 MM MISC: 31G X 8 MM | 30 days supply | Qty: 100 | Fill #0

## 2019-04-04 NOTE — Progress Notes (Signed)
Received order for PICC  

## 2019-04-04 NOTE — Progress Notes (Signed)
Inpatient Diabetes Program Recommendations  AACE/ADA: New Consensus Statement on Inpatient Glycemic Control (2015)  Target Ranges:  Prepandial:   less than 140 mg/dL      Peak postprandial:   less than 180 mg/dL (1-2 hours)      Critically ill patients:  140 - 180 mg/dL   Lab Results  Component Value Date   GLUCAP 249 (H) 04/04/2019   HGBA1C 8.9 (H) 03/31/2019    Review of Glycemic Control Results for MIKALE, SILVERSMITH (MRN [id number]) as of 04/04/2019 14:50  Ref. Range 04/03/2019 22:57 04/04/2019 07:57 04/04/2019 12:11  Glucose-Capillary Latest Ref Range: 70 - 99 mg/dL 256 (H) 177 (H) 249 (H)   Diabetes history: New diagnosis Outpatient Diabetes medications:  None Current orders for Inpatient glycemic control:  Novolog moderate tid with meals and HS Lantus 12 units daily  Inpatient Diabetes Program Recommendations:    Spoke with pt about new diagnosis of DM via phone.  Discussed A1C results with him and explained what an A1C is, basic pathophysiology of DM Type 2, basic home care, importance of checking CBGs and maintaining good CBG control to prevent long-term and short-term complications.  Reviewed signs and symptoms of hyperglycemia and hypoglycemia.  RNs to provide ongoing basic DM education at bedside with this patient.  Have ordered educational booklet, insulin starter kit, and DM videos.  Will follow up with patient on 8/5 to show him how to self-administer insulin and answer any further questions.   Thanks  Adah Perl, RN, BC-ADM Inpatient Diabetes Coordinator Pager [phone] (8a-5p)

## 2019-04-04 NOTE — Progress Notes (Addendum)
   Subjective:   Patient notes that he has been having night time sweating, but this is not new. It was occurring prior to admission.   Additionally, patient c/o right medial ankle pain that began overnight. Denies any falls.   Objective:  Vital signs in last 24 hours: Vitals:   04/03/19 1633 04/03/19 2114 04/04/19 0500 04/04/19 1222  BP:  114/82 113/67 109/81  Pulse:  96 (!) 104 97  Resp:  18 20 18   Temp: 98.4 F (36.9 C) 98.7 F (37.1 C) 98.8 F (37.1 C) 99.2 F (37.3 C)  TempSrc: Oral Oral Oral Oral  SpO2:  97% 98% 97%  Weight:   59 kg   Height:       Physical Exam Vitals signs and nursing note reviewed.  Constitutional:      General: He is not in acute distress.    Appearance: He is normal weight. He is not toxic-appearing.  HENT:     Head: Normocephalic and atraumatic.  Pulmonary:     Effort: Pulmonary effort is normal. No respiratory distress.  Abdominal:     General: Bowel sounds are normal. There is no distension.     Palpations: Abdomen is soft.     Tenderness: There is no abdominal tenderness. There is no guarding.  Musculoskeletal:     Right ankle: He exhibits normal range of motion, no swelling, no ecchymosis, no deformity and no laceration. No tenderness.  Skin:    General: Skin is warm and dry.  Neurological:     General: No focal deficit present.     Mental Status: He is alert and oriented to person, place, and time. Mental status is at baseline.  Psychiatric:        Mood and Affect: Mood normal.        Behavior: Behavior normal.    Assessment/Plan:  Principal Problem:   Bacteremia due to Klebsiella pneumoniae Active Problems:   Liver mass, right lobe  #Klebsiella Bacteremia 2/2 Urinary Source #Hepatic Abscess  #Probable prostatitis  CT on admission notable for prostate enlargement and urine culture was positive for Klebsiella as well. Prostatitis is likely the infection origin. IR placed drain for hepatic abscess on July 31st. They are on  board and following.   Patient has remained afebrile for over 24 hours at this time. His WBC continues to trend down. PICC order has been placed and pending. If patient remains afebrile till this afternoon, making it 48 hrs without fevers and receives his PICC, would consider discharge. Plan to discuss with IR, as they have not recommended drain removal yet.   PCP follow up with [facility] on August 13th.   Discussed antibiotics with Dr. Drucilla Schmidt, who agrees IV Ceftriaxone for 4 weeks is appropriate. A follow up appointment with Dr.VanDam has been scheduled for August 27th @ 9am.    - Continue to trend temperature.  - Ceftriaxone for 4-6 weeks.  - PICC ordered and pending.  - Follow up with IR about drain   #Diabetes Mr. Vanrossum remains above his CBG goal of 180. He is currently receiving Lantus 10mg  QD. He is in agreement to be discharged with insulin, pending further workup to determine if oral medication will be sufficient.   - Increase Lantus to 12 units QD    Dispo: Anticipated discharge in approximately 0-1 days.    Dr. Jose Persia Internal Medicine PGY-1  Pager: [phone] 04/04/2019, 3:16 PM

## 2019-04-04 NOTE — Progress Notes (Signed)
Referring Physician(s): Dr Orie Fisherman Dr Vivien Rossetti   Supervising Physician: Sandi Mariscal  Patient Status:  [facility] - In-pt  Chief Complaint:  Hepatic Abscess drain placement 7/31  Subjective:  Still with soreness at site--- less today OP bloody/purulent Overall Feels some better  Allergies: Patient has no known allergies.  Medications: Prior to Admission medications   Medication Sig Start Date End Date Taking? Authorizing Provider  acetaminophen (TYLENOL) 500 MG tablet Take 500-1,000 mg by mouth every 6 (six) hours as needed for fever.   Yes [provider]     Vital Signs: BP 113/67 (BP Location: Left Arm)   Pulse (!) 104   Temp 98.8 F (37.1 C) (Oral)   Resp 20   Ht 5\' 8"  (1.727 m)   Wt 130 lb 1.1 oz (59 kg)   SpO2 98%   BMI 19.78 kg/m   Physical Exam Abdominal:     Tenderness: There is abdominal tenderness.  Skin:    General: Skin is warm and dry.     Comments: Site is clean and dry Minimally tender at site today  OP drain is blood tinged - purulent  Culture MODERATE KLEBSIELLA PNEUMONIAE  NO ANAEROBES ISOLATED; CULTURE IN PROGRESS FOR 5 DAYS  Report Status PENDING  Organism ID, Bacteria KLEBSIELLA PNEUMONIAE       Imaging: Ct Image Guided Drainage By Percutaneous Catheter  Result Date: 03/31/2019 CLINICAL DATA:  Fever, hepatic abscess EXAM: CT AND ULTRASOUND GUIDED DRAINAGE OF HEPATIC ABSCESS ANESTHESIA/SEDATION: Intravenous Fentanyl 54mcg and Versed 100mg  were administered as conscious sedation during continuous monitoring of the patient's level of consciousness and physiological / cardiorespiratory status by the radiology RN, with a total moderate sedation time of 21 minutes. PROCEDURE: The procedure, risks, benefits, and alternatives were explained to the patient. Questions regarding the procedure were encouraged and answered. The patient understands and consents to the procedure. Limited axial scans through the liver obtained and the  collection was again localized. The lesion could also be identified on ultrasound which allowed an advantageous subcostal approach. The operative field was prepped with chlorhexidinein a sterile fashion, and a sterile drape was applied covering the operative field. A sterile gown and sterile gloves were used for the procedure. Local anesthesia was provided with 1% Lidocaine. Under real-time ultrasound guidance, 18 gauge trocar needle advanced into the collection under ultrasound from a subcostal approach. Amplatz guidewire advanced easily within the collection. CT confirmed appropriate positioning. Tract dilated to facilitate placement of a 12 French pigtail drain catheter, formed centrally within the collection. 45 mL purulent material were aspirated, sample sent for Gram stain and culture. Catheter was secured externally with 0 Prolene suture and StatLock and placed to gravity drainage. The patient tolerated the procedure well. COMPLICATIONS: Bradshaw immediate FINDINGS: Complex segment 8 hepatic abscess was again localized. 12 French pigtail drain catheter placed as above. 45 mL purulent material aspirated, sample sent for Gram stain and culture. IMPRESSION: 1. Technically successful CT and ultrasound-guided hepatic abscess drain catheter placement Electronically Signed   By: Lucrezia Europe M.D.   On: 03/31/2019 16:08   [place] Ekg Site Rite  Result Date: 04/04/2019 If Site Rite image not attached, placement could not be confirmed due to current cardiac rhythm.   Labs:  CBC: Recent Labs    04/01/19 0240 04/02/19 0503 04/03/19 0301 04/04/19 0324  WBC 7.8  7.8 11.0* 9.5 8.7  HGB 10.8*  10.8* 10.5* 10.6* 11.4*  HCT 31.5*  31.6* 30.8* 32.3* 34.1*  PLT 336  328 349 399 507*    COAGS: Recent Labs    03/31/19 1153  INR 1.1    BMP: Recent Labs    04/01/19 0240 04/02/19 0503 04/03/19 0301 04/04/19 0818  NA 127* 135 132* 134*  K 3.2* 3.5 3.7 4.0  CL 94* 100 96* 97*  CO2 19* 24 23 25   GLUCOSE  238* 207* 218* 210*  BUN 12 11 12 9   CALCIUM 7.8* 8.0* 7.9* 8.3*  CREATININE 0.82 0.75 0.68 0.56*  GFRNONAA >60 >60 >60 >60  GFRAA >60 >60 >60 >60    LIVER FUNCTION TESTS: Recent Labs    03/31/19 0530 04/01/19 0240 04/03/19 0301  BILITOT 1.9* 1.0 0.6  AST 38 37 15  ALT 56* 49* 29  ALKPHOS 405* 305* 243*  PROT 6.6 5.7* 5.6*  ALBUMIN 2.5* 2.0* 1.9*    Assessment and Plan:  Drain flushes easily per RN OP still 30 cc daily Wbc down; afeb Will follow Will need CT when OP less 10 cc daily   Electronically Signed: Robet LeuPamela A Tyrease Vandeberg, PA-C 04/04/2019, 11:57 AM   I spent a total of 15 Minutes at the the patient's bedside AND on the patient's hospital floor or unit, greater than 50% of which was counseling/coordinating care for hepatic abscess drain

## 2019-04-04 NOTE — Progress Notes (Signed)
PHARMACY CONSULT NOTE FOR:  OUTPATIENT  PARENTERAL ANTIBIOTIC THERAPY (OPAT)  Indication: hepatitis abscess Regimen: ceftriaxone 2 grams IV every 24 hours End date: 04/28/2019  IV antibiotic discharge orders are pended. To discharging provider:  please sign these orders via discharge navigator,  Select New Orders & click on the button choice - Manage This Unsigned Work.     Thank you for allowing pharmacy to be a part of this patient's care.  Eddie Candle, PharmD PGY-1 Pharmacy Resident  Phone: [phone]

## 2019-04-04 NOTE — Progress Notes (Signed)
Peripherally Inserted Central Catheter/Midline Placement  The IV Nurse has discussed with the patient and/or persons authorized to consent for the patient, the purpose of this procedure and the potential benefits and risks involved with this procedure.  The benefits include less needle sticks, lab draws from the catheter, and the patient may be discharged home with the catheter. Risks include, but not limited to, infection, bleeding, blood clot (thrombus formation), and puncture of an artery; nerve damage and irregular heartbeat and possibility to perform a PICC exchange if needed/ordered by physician.  Alternatives to this procedure were also discussed.  Bard Power PICC patient education guide, fact sheet on infection prevention and patient information card has been provided to patient /or left at bedside.    PICC/Midline Placement Documentation  PICC Single Lumen 04/04/19 PICC Right Cephalic 36 cm 0 cm (Active)  Indication for Insertion or Continuance of Line Home intravenous therapies (PICC only) 04/04/19 1736  Exposed Catheter (cm) 0 cm 04/04/19 1736  Site Assessment Clean;Dry;Intact 04/04/19 1736  Line Status Flushed;Blood return noted 04/04/19 1736  Dressing Type Transparent 04/04/19 1736  Dressing Status Clean;Dry;Intact;Antimicrobial disc in place 04/04/19 1736  Dressing Intervention New dressing 04/04/19 1736  Dressing Change Due 04/11/19 04/04/19 1736       Christella Noa Albarece 04/04/2019, 5:37 PM

## 2019-04-05 LAB — AEROBIC/ANAEROBIC CULTURE W GRAM STAIN (SURGICAL/DEEP WOUND): Special Requests: NORMAL

## 2019-04-05 LAB — GLUCOSE, CAPILLARY
Glucose-Capillary: 192 mg/dL — ABNORMAL HIGH (ref 70–99)
Glucose-Capillary: 278 mg/dL — ABNORMAL HIGH (ref 70–99)

## 2019-04-05 MED ORDER — CEFTRIAXONE IV (FOR PTA / DISCHARGE USE ONLY): 2.0000 g | INTRAVENOUS | 0 refills | Status: DC

## 2019-04-05 MED ORDER — HEPARIN SOD (PORK) LOCK FLUSH 100 UNIT/ML IV SOLN
250.0000 [IU] | INTRAVENOUS | Status: AC | PRN
  Administered 2019-04-05: 250 [IU]

## 2019-04-05 MED ORDER — INSULIN STARTER KIT- PEN NEEDLES (ENGLISH): 1.0000 | Freq: Once | 0 refills | Status: AC

## 2019-04-05 NOTE — Progress Notes (Signed)
   Subjective:   Patient doing well this AM. He has been educated on using the PICC and insulin injections. He has a follow-up appointment with ID. We stressed the importance of following up with ID. Discussed the length of treatment for his infection. He voices understanding. Discussed that IR will schedule and appointment with him to determine when his drain can be removed. This should occurred within 7 days. All questions and concerns addressed.   Objective: Vital signs in last 24 hours: Vitals:   04/04/19 1222 04/04/19 2116 04/05/19 0229 04/05/19 0630  BP: 109/81 101/77  105/90  Pulse: 97 97  88  Resp: 18 (!) 22  (!) 22  Temp: 99.2 F (37.3 C) 99 F (37.2 C) 98.8 F (37.1 C) 98.3 F (36.8 C)  TempSrc: Oral Oral Oral Oral  SpO2: 97% 97%  96%  Weight:    58.3 kg  Height:       Physical Exam Vitals signs and nursing note reviewed.  Constitutional:      Appearance: He is normal weight.  HENT:     Head: Normocephalic and atraumatic.  Cardiovascular:     Rate and Rhythm: Normal rate and regular rhythm.     Heart sounds: No murmur. No gallop.   Pulmonary:     Effort: Pulmonary effort is normal. No respiratory distress.  Abdominal:     General: Bowel sounds are normal. There is no distension.     Palpations: Abdomen is soft.     Tenderness: There is abdominal tenderness (surrounding drain ). There is no guarding.  Skin:    General: Skin is warm and dry.  Neurological:     General: No focal deficit present.     Mental Status: He is alert. Mental status is at baseline.  Psychiatric:        Mood and Affect: Mood normal.        Behavior: Behavior normal.    Assessment/Plan:  Principal Problem:   Bacteremia due to Klebsiella pneumoniae Active Problems:   Liver mass, right lobe  #Klebsiella Bacteremia 2/2 Urinary Source #Hepatic Abscess  #Probable prostatitis  CT on admission notable for prostate enlargement and urine culture was positive for Klebsiella as well.  Prostatitis is likely the infection origin. IR placed drain for hepatic abscess on July 31st. They would like for patient to be discharged with the drain and they will follow up with him in approximately 1 week.   No acute complaints today. Patient has remained afebrile for nearly 3 days. He has had his PICC placed and has been instructed on use. He understands that he will be on IV antibiotics for 1 month. Follow up with ID and PCP has been scheduled.  #Diabetes Mellitus:  Since patient was admitted with high sugars and he was unaware of this prior, type of diabetes was unknown. Mostly likely type 2 given that his age and ethnicity. C peptide has been ordered and pending. If returns normal or high, plan to start patient on Metformin.  Discharge today with Lantus 12 units QD.      Dispo: Anticipated discharge today.   Dr. Jose Persia Internal Medicine PGY-1  Pager: [phone] 04/05/2019, 10:55 AM

## 2019-04-05 NOTE — TOC Transition Note (Signed)
Transition of Care [facility]) - CM/SW Discharge Note   Patient Details  Name: Gary Bradshaw MRN: [id number] Date of Birth: 04/28/1981  [facility] of [facility]) CM/SW Contact:  Sharin Mons, RN Phone Number: 04/05/2019, 1:15 PM   Clinical Narrative:  Admitted with Bacteremia due to Klebsiella pneumoniae Pt will transition to home today with home health services/ home IV infusion (ABX). Pt has transportation to home. Rx meds delivered to pt's bedside by [facility] pharmacy, 04/04/2019.  Final next level of care: [place] Barriers to Discharge: No Barriers Identified   Patient Goals and CMS Choice Patient states their goals for this hospitalization and ongoing recovery are:: Return home CMS Medicare.gov Compare Post Acute Care list provided to:: Patient Choice offered to / list presented to : Patient  Discharge Placement                       Discharge Plan and Services In-house Referral: NA Discharge Planning Services: CM Consult Post Acute Care Choice: Home Health          DME Arranged: IV pump/equipment DME Agency: Other - Comment([facility]) Date DME Agency Contacted: 04/03/19 Time DME Agency Contacted: 1527 Representative spoke with at DME Agency: [place]: RN [place] Agency: [place] ([facility]) Date [place]: 04/04/19 Time [place]: [phone] Representative spoke with at [place]: [place] ([place]) Interventions     Readmission Risk Interventions No flowsheet data found.

## 2019-04-05 NOTE — Progress Notes (Signed)
Pt refused to flash his drain at this time, stating his girlfriend will be doing it at home. Pt needs to make sure she get educated prior to pt discharge. He also refused to self administer insulin stating, he already did it earlier.  RN educated pt, and explained each step.

## 2019-04-05 NOTE — Progress Notes (Signed)
Nsg Discharge Note  Admit Date:  03/31/2019 Discharge date: 04/05/2019   Ervey Fallin to be D/C'd Home per MD order.  AVS completed.  Copy for chart, and copy for patient signed, and dated. Patient/caregiver able to verbalize understanding.  Discharge Medication: Allergies as of 04/05/2019   No Known Allergies     Medication List    TAKE these medications   acetaminophen 500 MG tablet Commonly known as: TYLENOL Take 500-1,000 mg by mouth every 6 (six) hours as needed for fever.   cefTRIAXone  IVPB Commonly known as: ROCEPHIN Inject 2 g into the vein daily for 24 days. Indication:  Hepatitis abscess Last Day of Therapy:  04/28/2019 Labs - Once weekly:  CBC/D and BMP, Labs - Every other week:  ESR and CRP   Insulin Glargine 100 UNIT/ML Solostar Pen Commonly known as: LANTUS Inject 12 Units into the skin daily.   insulin starter kit- pen needles Misc 1 kit by Other route once for 1 dose.            Home Infusion Instuctions  (From admission, onward)         Start     Ordered   04/05/19 0000  Home infusion instructions [facility] May follow [place] Dosing Protocol; May administer Cathflo as needed to maintain patency of vascular access device.; Flushing of vascular access device: per [facility] Protocol: 0.9% NaCl pre/post medica...    Question Answer Comment  Instructions May follow [place] Dosing Protocol   Instructions May administer Cathflo as needed to maintain patency of vascular access device.   Instructions Flushing of vascular access device: per [facility] Protocol: 0.9% NaCl pre/post medication administration and prn patency; Heparin 100 u/ml, 58m for implanted ports and Heparin 10u/ml, 593mfor all other central venous catheters.   Instructions May follow [facility] Anaphylaxis Protocol for First Dose Administration in the home: 0.9% NaCl at 25-50 ml/hr to maintain IV access for protocol meds. Epinephrine 0.3 ml IV/IM PRN and Benadryl 25-50 IV/IM PRN s/s of anaphylaxis.    Instructions [facility] Infusion Coordinator (RN) to assist per patient IV care needs in the home PRN.      04/05/19 1052          Discharge Assessment: Vitals:   04/05/19 0630 04/05/19 1234  BP: 105/90 112/87  Pulse: 88 95  Resp: (!) 22 19  Temp: 98.3 F (36.8 C) 98.9 F (37.2 C)  SpO2: 96% 97%   Skin clean, dry and intact without evidence of skin break down, no evidence of skin tears noted. IV catheter discontinued intact. Site without signs and symptoms of complications - no redness or edema noted at insertion site, patient denies c/o pain - only slight tenderness at site.  Dressing with slight pressure applied.  D/c Instructions-Education: Discharge instructions given to patient/family with verbalized understanding. D/c education completed with patient/family including follow up instructions, medication list, d/c activities limitations if indicated, with other d/c instructions as indicated by MD - patient able to verbalize understanding, all questions fully answered. Patient instructed to return to ED, call 911, or call MD for any changes in condition.  Patient escorted via [place] D/C home via private auto.  MaTresa EndoRN 04/05/2019 4:32 PM

## 2019-04-05 NOTE — Progress Notes (Signed)
Inpatient Diabetes Program Recommendations  AACE/ADA: New Consensus Statement on Inpatient Glycemic Control (2015)  Target Ranges:  Prepandial:   less than 140 mg/dL      Peak postprandial:   less than 180 mg/dL (1-2 hours)      Critically ill patients:  140 - 180 mg/dL   Lab Results  Component Value Date   GLUCAP 192 (H) 04/05/2019   HGBA1C 8.9 (H) 03/31/2019    Review of Glycemic Control Results for Gary Bradshaw, Gary Bradshaw (MRN [id number]) as of 04/05/2019 12:17  Ref. Range 04/04/2019 07:57 04/04/2019 12:11 04/04/2019 16:50 04/04/2019 21:19 04/05/2019 08:02  Glucose-Capillary Latest Ref Range: 70 - 99 mg/dL 177 (H) 249 (H) 266 (H) 216 (H) 192 (H)   Diabetes history: New diagnosis of diabetes (patient states that his father has diabetes) Outpatient Diabetes medications:  None Current orders for Inpatient glycemic control:  Novolog moderate tid with meals and HS Lantus 12 units daily  Inpatient Diabetes Program Recommendations:    Educated patient and girlfriend on insulin pen use at home.  Reviewed contents of insulin flexpen starter kit.  Reviewed all steps if insulin pen including attachment of needle, 2-unit air shot, dialing up dose, giving injection, removing needle, disposal of sharps, storage of unused insulin, disposal of insulin etc.  Patient's girlfriend able to provide successful return demonstration.  Also reviewed troubleshooting with insulin pen.  MD to give patient Rxs for insulin pens and insulin pen needles. Patient's girlfriend is in medical school at [employer].  She states that patient will likely move to [place] with her once antibiotic treatment complete.  We also discussed hypoglycemia signs/symptoms and treatment.  Gave patient information regarding meter/monitoring and normal blood sugars (Goal 80-130 mg/dL fasting, <180 post-prandial).  Discussed importance of glycemic control for healing as well.  They were appreciative of information and are hoping to discharge today.   Thanks  Adah Perl, RN, BC-ADM Inpatient Diabetes Coordinator Pager [phone] (8a-5p)

## 2019-04-05 NOTE — Discharge Summary (Signed)
Name: Gary Bradshaw MRN: [id number] DOB: 10-03-1980 38 y.o. PCP: Patient, No Pcp Per  Date of Admission: 03/31/2019  3:40 AM Date of Discharge: 04/05/2019 Attending Physician: Lucious Groves, DO  Discharge Diagnosis: 1. Klebsiella bacteremia complicated by hepatic abscess. 2. Type II DM  Discharge Medications: Allergies as of 04/05/2019   No Known Allergies     Medication List    TAKE these medications   acetaminophen 500 MG tablet Commonly known as: TYLENOL Take 500-1,000 mg by mouth every 6 (six) hours as needed for fever.   cefTRIAXone  IVPB Commonly known as: ROCEPHIN Inject 2 g into the vein daily for 24 days. Indication:  Hepatitis abscess Last Day of Therapy:  04/28/2019 Labs - Once weekly:  CBC/D and BMP, Labs - Every other week:  ESR and CRP   Insulin Glargine 100 UNIT/ML Solostar Pen Commonly known as: LANTUS Inject 12 Units into the skin daily.   insulin starter kit- pen needles Misc 1 kit by Other route once for 1 dose.            Home Infusion Instuctions  (From admission, onward)         Start     Ordered   04/05/19 0000  Home infusion instructions [facility] May follow [place] Dosing Protocol; May administer Cathflo as needed to maintain patency of vascular access device.; Flushing of vascular access device: per [facility] Protocol: 0.9% NaCl pre/post medica...    Question Answer Comment  Instructions May follow [place] Dosing Protocol   Instructions May administer Cathflo as needed to maintain patency of vascular access device.   Instructions Flushing of vascular access device: per [facility] Protocol: 0.9% NaCl pre/post medication administration and prn patency; Heparin 100 u/ml, 28m for implanted ports and Heparin 10u/ml, 561mfor all other central venous catheters.   Instructions May follow [facility] Anaphylaxis Protocol for First Dose Administration in the home: 0.9% NaCl at 25-50 ml/hr to maintain IV access for protocol meds. Epinephrine 0.3 ml IV/IM  PRN and Benadryl 25-50 IV/IM PRN s/s of anaphylaxis.   Instructions [facility] Infusion Coordinator (RN) to assist per patient IV care needs in the home PRN.      04/05/19 1052        Disposition and follow-up:   [name]  At the hospital follow up visit please address:  1.  Klebsiella bacteremia complicated by hepatic abscess. Ensure the patient has all the home health resources that he needs and follows up with infectious disease. Type II DM. Continue to escalate the patient's metformin dose. He should be able to come off the Lantus and be transitioned to all oral medications.   2.  Labs / imaging needed at time of follow-up: None  3.  Pending labs/ test needing follow-up: None  Follow-up Appointments: Follow-up Information    [facility]   Why: Follow up appointment scheduled at 1:50pm with Dr. AnFreeman CaldronPlease attend this appointment to establish primary care. They have an onsite pharmacy.  Contact information: [phone]     [facility] Follow up.   Why: IV antibiotics arranged.  Contact information: [address].   Specialty: Home Health Services Why: Home Health RN/SW arranged. [place]  MD Follow up.   Specialty: Infectious Diseases Why: Follow up on August 27th, 9:00 AM Contact information: [address] [place] [address] [phone]         Hospital Course by problem list:  1. Klebsiella bacteremia complicated by hepatic abscess. Gary Bradshaw is a 38 year old male who presented to the emergency department with two weeks of shortness of breath and fevers. CT abdomen and pelvis in the emergency department with significant for a 4 cm complex hepatic abscess.  Interventional radiology was consulted and placed a percutaneous drain with approximately 40 mL of purulent material immediately drained. Blood cultures and wound cultures subsequently grew Klebsiella. Of note the patient's initial urine analysis was significant for hematuria, polyuria, and bacteria. Urine cultures also grown Klebsiella. While hospitalized she was treated with IV ceftriaxone. He was discharged with a PICC in place and will follow-up with infectious disease after four weeks of IV ceftriaxone. Right now we believe that his Klebsiella bacteremia secondary to urological source, possibly prostatitis.  2. Type II DM. The patient was noted to have persistently elevated CBG is greater than 200. C-peptide was checked and found to be within normal range and GAD antibodies were negative. He was initially discharged on Lantus 12 units daily; however, after the results of his C-peptide a prescription for metformin was sent out. He was notified of these results in the new prescription.  Discharge Vitals:   BP 112/87 (BP Location: Left Arm)   Pulse 95   Temp 98.9 F (37.2 C) (Oral)   Resp 19   Ht '5\' 8"'  (1.727 m)   Wt 58.3 kg   SpO2 97%   BMI 19.54 kg/m   Pertinent Labs, Studies, and Procedures:  Results for orders placed or performed during the hospital encounter of 03/31/19  SARS Coronavirus 2 [facility] order, Performed in [place] hospital lab)     Status: None   Collection Time: 03/31/19  6:50 AM  Result Value Ref Range Status   SARS Coronavirus 2 NEGATIVE NEGATIVE Final    Comment: (NOTE) If result is NEGATIVE SARS-CoV-2 target nucleic acids are NOT DETECTED. The SARS-CoV-2 RNA is generally detectable in upper and lower  respiratory specimens during the acute phase of infection. The lowest  concentration of SARS-CoV-2 viral copies this assay can detect is 250  copies / mL. A negative result does not preclude SARS-CoV-2 infection  and should not be used as the sole basis for  treatment or other  patient management decisions.  A negative result may occur with  improper specimen collection / handling, submission of specimen other  than nasopharyngeal swab, presence of viral mutation(s) within the  areas targeted by this assay, and inadequate number of viral copies  (<250 copies / mL). A negative result must be combined with clinical  observations, patient history, and epidemiological information. If result is POSITIVE SARS-CoV-2 target nucleic acids are DETECTED. The SARS-CoV-2 RNA is generally detectable in upper and lower  respiratory specimens dur ing the acute phase of infection.  Positive  results are indicative of active infection with SARS-CoV-2.  Clinical  correlation with patient history and other diagnostic information is  necessary to determine patient infection status.  Positive results do  not rule out bacterial infection or co-infection with other viruses. If result is PRESUMPTIVE POSTIVE SARS-CoV-2 nucleic acids MAY BE PRESENT.   A presumptive positive result was obtained on the submitted specimen  and confirmed on repeat testing.  While 2019 novel coronavirus  (SARS-CoV-2) nucleic acids may  be present in the submitted sample  additional confirmatory testing may be necessary for epidemiological  and / or clinical management purposes  to differentiate between  SARS-CoV-2 and other Sarbecovirus currently known to infect humans.  If clinically indicated additional testing with an alternate test  methodology [phone]) is advised. The SARS-CoV-2 RNA is generally  detectable in upper and lower respiratory sp ecimens during the acute  phase of infection. The expected result is Negative. Fact Sheet for Patients:  StrictlyIdeas.no Fact Sheet for Healthcare Providers: BankingDealers.co.za This test is not yet approved or cleared by the [place] FDA and has been authorized for detection and/or  diagnosis of SARS-CoV-2 by FDA under an Emergency Use Authorization (EUA).  This EUA will remain in effect (meaning this test can be used) for the duration of the COVID-19 declaration under Section 564(b)(1) of the Act, 21 U.S.C. section 360bbb-3(b)(1), unless the authorization is terminated or revoked sooner. Performed at [facility], [place] [address] [place], [place] [address]   Culture, Urine     Status: Abnormal   Collection Time: 03/31/19 11:45 AM   Specimen: Urine, Random  Result Value Ref Range Status   Specimen Description URINE, RANDOM  Final   Special Requests   Final    NONE Performed at [facility], [place] [address] [place], [place] [address]    Culture >=100,000 COLONIES/mL KLEBSIELLA PNEUMONIAE (A)  Final   Report Status 04/02/2019 FINAL  Final   Organism ID, Bacteria KLEBSIELLA PNEUMONIAE (A)  Final      Susceptibility   Klebsiella pneumoniae - MIC*    AMPICILLIN >=32 RESISTANT Resistant     CEFAZOLIN <=4 SENSITIVE Sensitive     CEFTRIAXONE <=1 SENSITIVE Sensitive     CIPROFLOXACIN <=0.25 SENSITIVE Sensitive     GENTAMICIN <=1 SENSITIVE Sensitive     IMIPENEM <=0.25 SENSITIVE Sensitive     NITROFURANTOIN 128 RESISTANT Resistant     TRIMETH/SULFA <=20 SENSITIVE Sensitive     AMPICILLIN/SULBACTAM 8 SENSITIVE Sensitive     PIP/TAZO <=4 SENSITIVE Sensitive     Extended ESBL NEGATIVE Sensitive     * >=100,000 COLONIES/mL KLEBSIELLA PNEUMONIAE  Aerobic/Anaerobic Culture (surgical/deep wound)     Status: None   Collection Time: 03/31/19  4:11 PM   Specimen: Abscess  Result Value Ref Range Status   Specimen Description ABSCESS LIVER  Final   Special Requests Normal  Final   Gram Stain   Final    ABUNDANT WBC PRESENT,BOTH PMN AND MONONUCLEAR ABUNDANT GRAM NEGATIVE RODS CRITICAL RESULT CALLED TO, READ BACK BY AND VERIFIED WITH: Phillips Climes RN 16:55 03/31/19 ([email or web address])    Culture   Final    MODERATE KLEBSIELLA PNEUMONIAE NO ANAEROBES ISOLATED Performed  at [facility], [address], [place], [place] [address]    Report Status 04/05/2019 FINAL  Final   Organism ID, Bacteria KLEBSIELLA PNEUMONIAE  Final      Susceptibility   Klebsiella pneumoniae - MIC*    AMPICILLIN >=32 RESISTANT Resistant     CEFAZOLIN <=4 SENSITIVE Sensitive     CEFEPIME <=1 SENSITIVE Sensitive     CEFTAZIDIME <=1 SENSITIVE Sensitive     CEFTRIAXONE <=1 SENSITIVE Sensitive     CIPROFLOXACIN <=0.25 SENSITIVE Sensitive     GENTAMICIN <=1 SENSITIVE Sensitive     IMIPENEM <=0.25 SENSITIVE Sensitive     TRIMETH/SULFA <=20 SENSITIVE Sensitive     AMPICILLIN/SULBACTAM 4 SENSITIVE Sensitive     PIP/TAZO <=4 SENSITIVE Sensitive  Extended ESBL NEGATIVE Sensitive     * MODERATE KLEBSIELLA PNEUMONIAE  Culture, blood (routine x 2)     Status: Abnormal   Collection Time: 03/31/19  4:52 PM   Specimen: BLOOD RIGHT HAND  Result Value Ref Range Status   Specimen Description BLOOD RIGHT HAND  Final   Special Requests   Final    BOTTLES DRAWN AEROBIC ONLY Blood Culture results may not be optimal due to an inadequate volume of blood received in culture bottles   Culture  Setup Time   Final    AEROBIC BOTTLE ONLY GRAM NEGATIVE RODS CRITICAL VALUE NOTED.  VALUE IS CONSISTENT WITH PREVIOUSLY REPORTED AND CALLED VALUE.    Culture (A)  Final    KLEBSIELLA PNEUMONIAE SUSCEPTIBILITIES PERFORMED ON PREVIOUS CULTURE WITHIN THE LAST 5 DAYS. Performed at [facility], [place] [address], [place], [place] [address]    Report Status 04/03/2019 FINAL  Final  Culture, blood (routine x 2)     Status: Abnormal   Collection Time: 03/31/19  4:53 PM   Specimen: BLOOD  Result Value Ref Range Status   Specimen Description BLOOD RIGHT ANTECUBITAL  Final   Special Requests   Final    BOTTLES DRAWN AEROBIC AND ANAEROBIC Blood Culture adequate volume   Culture  Setup Time   Final    GRAM NEGATIVE RODS IN BOTH AEROBIC AND ANAEROBIC BOTTLES CRITICAL RESULT CALLED TO, READ BACK BY AND  VERIFIED WITH: Ellin Mayhew [place] [id number] AT 71 BY CM Performed at [facility], [place] [address], [place], [place] [address]    Culture KLEBSIELLA PNEUMONIAE (A)  Final   Report Status 04/03/2019 FINAL  Final   Organism ID, Bacteria KLEBSIELLA PNEUMONIAE  Final      Susceptibility   Klebsiella pneumoniae - MIC*    AMPICILLIN >=32 RESISTANT Resistant     CEFAZOLIN <=4 SENSITIVE Sensitive     CEFEPIME <=1 SENSITIVE Sensitive     CEFTAZIDIME <=1 SENSITIVE Sensitive     CEFTRIAXONE <=1 SENSITIVE Sensitive     CIPROFLOXACIN <=0.25 SENSITIVE Sensitive     GENTAMICIN <=1 SENSITIVE Sensitive     IMIPENEM <=0.25 SENSITIVE Sensitive     TRIMETH/SULFA <=20 SENSITIVE Sensitive     AMPICILLIN/SULBACTAM 8 SENSITIVE Sensitive     PIP/TAZO <=4 SENSITIVE Sensitive     Extended ESBL NEGATIVE Sensitive     * KLEBSIELLA PNEUMONIAE  Blood Culture ID Panel (Reflexed)     Status: Abnormal   Collection Time: 03/31/19  4:53 PM  Result Value Ref Range Status   Enterococcus species NOT DETECTED NOT DETECTED Final   Listeria monocytogenes NOT DETECTED NOT DETECTED Final   Staphylococcus species NOT DETECTED NOT DETECTED Final   Staphylococcus aureus (BCID) NOT DETECTED NOT DETECTED Final   Streptococcus species NOT DETECTED NOT DETECTED Final   Streptococcus agalactiae NOT DETECTED NOT DETECTED Final   Streptococcus pneumoniae NOT DETECTED NOT DETECTED Final   Streptococcus pyogenes NOT DETECTED NOT DETECTED Final   Acinetobacter baumannii NOT DETECTED NOT DETECTED Final   Enterobacteriaceae species DETECTED (A) NOT DETECTED Final    Comment: Enterobacteriaceae represent a large family of gram-negative bacteria, not a single organism. CRITICAL RESULT CALLED TO, READ BACK BY AND VERIFIED WITH: PHARMD M [place] [id number] AT 90 BY CM    Enterobacter cloacae complex NOT DETECTED NOT DETECTED Final   Escherichia coli NOT DETECTED NOT DETECTED Final   Klebsiella oxytoca NOT DETECTED NOT DETECTED Final    Klebsiella pneumoniae DETECTED (A) NOT DETECTED  Final    Comment: CRITICAL RESULT CALLED TO, READ BACK BY AND VERIFIED WITH: PHARMD M [place] [id number] AT 43 AM BY CM    Proteus species NOT DETECTED NOT DETECTED Final   Serratia marcescens NOT DETECTED NOT DETECTED Final   Carbapenem resistance NOT DETECTED NOT DETECTED Final   Haemophilus influenzae NOT DETECTED NOT DETECTED Final   Neisseria meningitidis NOT DETECTED NOT DETECTED Final   Pseudomonas aeruginosa NOT DETECTED NOT DETECTED Final   Candida albicans NOT DETECTED NOT DETECTED Final   Candida glabrata NOT DETECTED NOT DETECTED Final   Candida krusei NOT DETECTED NOT DETECTED Final   Candida parapsilosis NOT DETECTED NOT DETECTED Final   Candida tropicalis NOT DETECTED NOT DETECTED Final    Comment: Performed at [facility], [place] [address], [place], [place] [address]   Discharge Instructions: Discharge Instructions    Call MD for:  persistant dizziness or light-headedness   Complete by: As directed    Call MD for:  persistant nausea and vomiting   Complete by: As directed    Call MD for:  redness, tenderness, or signs of infection (pain, swelling, redness, odor or green/yellow discharge around incision site)   Complete by: As directed    Call MD for:  severe uncontrolled pain   Complete by: As directed    Call MD for:  temperature >100.4   Complete by: As directed    Diet - low sodium heart healthy   Complete by: As directed    Discharge instructions   Complete by: As directed    Thank you for allowing [place] care for you during your stay.  You were admitted to the hospital after coming to the ER for fever, shortness of breath and abdominal pain.  Imaging of your stomach showed a large abscess in the liver.  Interventional radiology placed a drain and will follow-up with you after your discharge to manage that drain.  Cultures of your liver abscess, blood, and urine grew a bacteria called Klebsiella.  Due to this, we  would like for you to continue antibiotic treatment via the IV in your right upper arm for the duration of 1 month.  For further follow-up, we have scheduled you an appointment with the infectious disease doctors at the [facility] for Infectious Disease for August 27th at 9:00 AM.   In the hospital, your blood sugars were very high.  We feel you most likely have type 2 diabetes mellitus.  There is a lab test still pending that will help [place] confirm this.  Once we have the results, we will call you to let you know. While in the hospital, we treated you with insulin and we are discharging you with a type of insulin called Lantus.  Please inject 12 units daily as written on the instructions.  Your diabetes will be managed by your new primary care physician with whom you have an appointment on August 13.   Home infusion instructions [facility] May follow [facility] Dosing Protocol; May administer Cathflo as needed to maintain patency of vascular access device.; Flushing of vascular access device: per [facility] Protocol: 0.9% NaCl pre/post medica...   Complete by: As directed    Instructions: May follow [place] Dosing Protocol   Instructions: May administer Cathflo as needed to maintain patency of vascular access device.   Instructions: Flushing of vascular access device: per [facility] Protocol: 0.9% NaCl pre/post medication administration and prn patency; Heparin 100 u/ml, 58m for implanted ports and  Heparin 10u/ml, 7m for all other central venous catheters.   Instructions: May follow [facility] Anaphylaxis Protocol for First Dose Administration in the home: 0.9% NaCl at 25-50 ml/hr to maintain IV access for protocol meds. Epinephrine 0.3 ml IV/IM PRN and Benadryl 25-50 IV/IM PRN s/s of anaphylaxis.   Instructions: [facility] Coord[facility] (RN) to assist per patient IV care needs in the home PRN.   Increase activity slowly   Complete by: As directed     Signed: JIna Homes MD  IMTS  PGY3

## 2019-04-06 ENCOUNTER — Other Ambulatory Visit: Payer: Self-pay | Admitting: Emergency Medicine

## 2019-04-06 DIAGNOSIS — K750 Abscess of liver: Secondary | ICD-10-CM

## 2019-04-06 LAB — GLUTAMIC ACID DECARBOXYLASE AUTO ABS: Glutamic Acid Decarb Ab: <5 U/mL (ref 0.0–5.0)

## 2019-04-06 LAB — C-PEPTIDE: C-Peptide: 2.5 ng/mL (ref 1.1–4.4)

## 2019-04-07 ENCOUNTER — Telehealth: Payer: Self-pay | Admitting: Internal Medicine

## 2019-04-07 DIAGNOSIS — E1169 Type 2 diabetes mellitus with other specified complication: Secondary | ICD-10-CM

## 2019-04-07 MED ORDER — METFORMIN HCL ER 500 MG PO TB24: 500.0000 mg | ORAL_TABLET | Freq: Every day | ORAL | 0 refills | Status: DC

## 2019-04-07 NOTE — Telephone Encounter (Signed)
Called the patient to discuss the results of his C-peptide and GAD antibodies. Will send out Metformin 500 mg QD. He has a PCP appointment on 8/13 and they can increase his dose at that time. He will continue the lantus for now and continue to monitor his CBG.

## 2019-04-11 ENCOUNTER — Other Ambulatory Visit: Payer: Self-pay | Admitting: Infectious Disease

## 2019-04-11 ENCOUNTER — Encounter: Payer: Self-pay | Admitting: *Deleted

## 2019-04-11 ENCOUNTER — Encounter

## 2019-04-11 ENCOUNTER — Ambulatory Visit
Admission: RE | Admit: 2019-04-11 | Discharge: 2019-04-11 | Disposition: A | Discharge status: Home / Self Care | Payer: Self-pay | Source: Ambulatory Visit | Attending: Student | Admitting: Student

## 2019-04-11 ENCOUNTER — Ambulatory Visit
Admission: RE | Admit: 2019-04-11 | Discharge: 2019-04-11 | Disposition: A | Discharge status: Home / Self Care | Payer: Self-pay | Source: Ambulatory Visit | Attending: Emergency Medicine | Admitting: Emergency Medicine

## 2019-04-11 DIAGNOSIS — K750 Abscess of liver: Secondary | ICD-10-CM

## 2019-04-11 HISTORY — PX: IR RADIOLOGIST EVAL & MGMT: IMG5224

## 2019-04-11 MED ORDER — IOPAMIDOL (ISOVUE-300) INJECTION 61%
100.0000 mL | Freq: Once | INTRAVENOUS | Status: AC | PRN
  Administered 2019-04-11: 100 mL via INTRAVENOUS

## 2019-04-11 NOTE — Progress Notes (Signed)
Chief Complaint: Liver abscess  Referring Physician(s): Louk,Alexandra M  History of Present Illness: Gary Bradshaw is a 38 y.o. male presenting today to [place] clinic for follow up of his liver abscess drain.    He is here today with his girlfriend, who translates for him.  He seems to understand our conversation, but his conversational English is limited.   He was admitted to [facility] 03/31/2019 with fever and constitutional symptoms, found to have right liver abscess.   This was drained by CT guided drain 03/31/2019.    He was [place] home with PICC and rocephin IV abx, with follow up with ID and medicine.  He does not have a surgical team following him.   Since his discharge, he has felt fatigued and weak.  He denies any recurrent fevers.  Denies significant pain.   His drain is 62F pigtail drain, to gravity.  He flushes this daily with sterile saline.  He records output daily, averaging 25cc output daily.  The output is typical anchovy paste colored fluid, low viscosity.  He receives his IV ABX at home, administered by his GF.    He has follow up appt with Dr. Tommy Medal of ID on 04/27/19.  He has not yet had any appointment.    CT today shows the drain in good position in the right liver abscess.  Persisting collection, with internal septation.  The diameter is significantly smaller than the baseline.   No past medical history on file.  Past Surgical History:  Procedure Laterality Date   IR RADIOLOGIST EVAL & MGMT  04/11/2019    Allergies: Patient has no known allergies.  Medications: Prior to Admission medications   Medication Sig Start Date End Date Taking? Authorizing Provider  acetaminophen (TYLENOL) 500 MG tablet Take 500-1,000 mg by mouth every 6 (six) hours as needed for fever.    [provider]  cefTRIAXone (ROCEPHIN) IVPB Inject 2 g into the vein daily for 24 days. Indication:  Hepatitis abscess Last Day of Therapy:  04/28/2019 Labs - Once weekly:  CBC/D and  BMP, Labs - Every other week:  ESR and CRP 04/05/19 04/29/19  Jose Persia, MD  Insulin Glargine (LANTUS) 100 UNIT/ML Solostar Pen Inject 12 Units into the skin daily. 04/04/19   Jose Persia, MD  metFORMIN (GLUCOPHAGE-XR) 500 MG 24 hr tablet Take 1 tablet (500 mg total) by mouth daily with breakfast. 04/07/19   Ina Homes, MD     No family history on file.  Social History   Socioeconomic History   Marital status: Single    Spouse name: Not on file   Number of children: Not on file   Years of education: Not on file   Highest education level: Not on file  Occupational History   Not on file  Social Needs   Financial resource strain: Not on file   Food insecurity    Worry: Not on file    Inability: Not on file   Transportation needs    Medical: Not on file    Non-medical: Not on file  Tobacco Use   Smoking status: Not on file  Substance and Sexual Activity   Alcohol use: Not on file   Drug use: Not on file   Sexual activity: Not on file  Lifestyle   Physical activity    Days per week: Not on file    Minutes per session: Not on file   Stress: Not on file  Relationships  Social Herbalist on phone: Not on file    Gets together: Not on file    Attends religious service: Not on file    Active member of club or organization: Not on file    Attends meetings of clubs or organizations: Not on file    Relationship status: Not on file  Other Topics Concern   Not on file  Social History Narrative   Not on file       Review of Systems: A 12 point ROS discussed and pertinent positives are indicated in the HPI above.  All other systems are negative.  Review of Systems  Vital Signs: BP 116/85    Pulse (!) 102    Temp 98 F (36.7 C)    SpO2 98%   Physical Exam General: 38 yo male appearing  stated age.  Well-developed, well-nourished.  Appearing weak.  HEENT: Atraumatic, normocephalic.  Conjugate gaze, extra-ocular motor intact. No scleral  icterus or scleral injection. No lesions on external ears, nose, lips, or gums.  Oral mucosa moist, pink.  Neck: Symmetric with no goiter enlargement.  Chest/Lungs:  Symmetric chest with inspiration/expiration.  No labored breathing.    Heart:    No JVD appreciated.  Abdomen:  Soft, NT/ND, with + bowel sounds.   Genito-urinary: Deferred Neurologic: Alert & Oriented to person, place, and time.   Normal affect and insight.  Appropriate questions.  Moving all 4 extremities with gross sensory intact.  Drain with tan-brown milky fluid.  .  Mallampati Score:     Imaging: Ct Angio Chest Pe W And/or Wo Contrast  Result Date: 03/31/2019 CLINICAL DATA:  38 year old male with shortness of breath, fever, abdominal pain and nausea for 2 weeks. EXAM: CT ANGIOGRAPHY CHEST CT ABDOMEN AND PELVIS WITH CONTRAST TECHNIQUE: Multidetector CT imaging of the chest was performed using the standard protocol during bolus administration of intravenous contrast. Multiplanar CT image reconstructions and MIPs were obtained to evaluate the vascular anatomy. Multidetector CT imaging of the abdomen and pelvis was performed using the standard protocol during bolus administration of intravenous contrast. CONTRAST:  132m OMNIPAQUE IOHEXOL 350 MG/ML SOLN COMPARISON:  None. FINDINGS: CTA CHEST FINDINGS Cardiovascular: This is a technically satisfactory study. There is no evidence of pulmonary emboli. Heart size normal. No evidence of thoracic aortic aneurysm or dissection. No pericardial effusion. Mediastinum/Nodes: No enlarged mediastinal, hilar, or axillary lymph nodes. Thyroid gland, trachea, and esophagus demonstrate no significant findings. Lungs/Pleura: An 8 mm noncalcified nodule at the LEFT LOWER lobe base is identified (Series 8: Image 71). The lungs are otherwise clear. No airspace disease, consolidation, mass, pleural effusion or pneumothorax. Musculoskeletal: No acute or suspicious bony abnormalities identified. Review of the  MIP images confirms the above findings. CT ABDOMEN and PELVIS FINDINGS Hepatobiliary: A 6 x 9 x 8 cm cystic lesion within the RIGHT liver contains multiple septations. This is nonspecific but is suspicious for abscess given patient's symptoms. The remainder of the liver and gallbladder are unremarkable. No biliary dilatation. Pancreas: Unremarkable Spleen: Unremarkable Adrenals/Urinary Tract: Subtle wedge-shaped areas of differential attenuation within the kidneys are noted and could represent pyelonephritis. No other renal abnormalities identified. The adrenal glands and bladder are unremarkable. Stomach/Bowel: Stomach is within normal limits. No evidence of bowel wall thickening, distention, or inflammatory changes. Vascular/Lymphatic: No significant vascular findings are present. No enlarged abdominal or pelvic lymph nodes. Reproductive: Heterogeneous enlargement of the RIGHT prostate. Other: No ascites, pneumoperitoneum or abdominal wall hernia. Musculoskeletal: No acute or suspicious bony abnormalities.  confirms the above findings. IMPRESSION: 1. 6 x 9 x 8 cm RIGHT hepatic complex cystic lesion, likely representing an abscess given patient's clinical history. 2. Subtle wedge-shaped areas of differential perfusion within both kidneys which could represent pyelonephritis. No evidence of renal abscess. 3. Nonspecific heterogeneous enlargement of the RIGHT prostate. Correlate clinically. 4. 8 mm LEFT LOWER lobe pulmonary nodule. Non-contrast chest CT at 6-12 months is recommended. If the nodule is stable at time of repeat CT, then future CT at 18-24 months (from today's scan) is considered optional for low-risk patients, but is recommended for high-risk patients. This recommendation follows the consensus statement: Guidelines for Management of Incidental Pulmonary Nodules Detected on CT Images: From the Fleischner Society 2017; Radiology 2017; 284:228-243. Electronically Signed   By:  Jeffrey  Hu M.D.   On: 03/31/2019 09:37  ° °Ct Abdomen Pelvis W Contrast ° °Result Date: 03/31/2019 °CLINICAL DATA:  37-year-old male with shortness of breath, fever, abdominal pain and nausea for 2 weeks. EXAM: CT ANGIOGRAPHY CHEST CT ABDOMEN AND PELVIS WITH CONTRAST TECHNIQUE: Multidetector CT imaging of the chest was performed using the standard protocol during bolus administration of intravenous contrast. Multiplanar CT image reconstructions and MIPs were obtained to evaluate the vascular anatomy. Multidetector CT imaging of the abdomen and pelvis was performed using the standard protocol during bolus administration of intravenous contrast. CONTRAST:  100mL OMNIPAQUE IOHEXOL 350 MG/ML SOLN COMPARISON:  None. FINDINGS: CTA CHEST FINDINGS Cardiovascular: This is a technically satisfactory study. There is no evidence of pulmonary emboli. Heart size normal. No evidence of thoracic aortic aneurysm or dissection. No pericardial effusion. Mediastinum/Nodes: No enlarged mediastinal, hilar, or axillary lymph nodes. Thyroid gland, trachea, and esophagus demonstrate no significant findings. Lungs/Pleura: An 8 mm noncalcified nodule at the LEFT LOWER lobe base is identified (Series 8: Image 71). The lungs are otherwise clear. No airspace disease, consolidation, mass, pleural effusion or pneumothorax. Musculoskeletal: No acute or suspicious bony abnormalities identified. Review of the MIP images confirms the above findings. CT ABDOMEN and PELVIS FINDINGS Hepatobiliary: A 6 x 9 x 8 cm cystic lesion within the RIGHT liver contains multiple septations. This is nonspecific but is suspicious for abscess given patient's symptoms. The remainder of the liver and gallbladder are unremarkable. No biliary dilatation. Pancreas: Unremarkable Spleen: Unremarkable Adrenals/Urinary Tract: Subtle wedge-shaped areas of differential attenuation within the kidneys are noted and could represent pyelonephritis. No other renal abnormalities  identified. The adrenal glands and bladder are unremarkable. Stomach/Bowel: Stomach is within normal limits. No evidence of bowel wall thickening, distention, or inflammatory changes. Vascular/Lymphatic: No significant vascular findings are present. No enlarged abdominal or pelvic lymph nodes. Reproductive: Heterogeneous enlargement of the RIGHT prostate. Other: No ascites, pneumoperitoneum or abdominal wall hernia. Musculoskeletal: No acute or suspicious bony abnormalities. Review of the MIP images confirms the above findings. IMPRESSION: 1. 6 x 9 x 8 cm RIGHT hepatic complex cystic lesion, likely representing an abscess given patient's clinical history. 2. Subtle wedge-shaped areas of differential perfusion within both kidneys which could represent pyelonephritis. No evidence of renal abscess. 3. Nonspecific heterogeneous enlargement of the RIGHT prostate. Correlate clinically. 4. 8 mm LEFT LOWER lobe pulmonary nodule. Non-contrast chest CT at 6-12 months is recommended. If the nodule is stable at time of repeat CT, then future CT at 18-24 months (from today's scan) is considered optional for low-risk patients, but is recommended for high-risk patients. This recommendation follows the consensus statement: Guidelines for Management of Incidental Pulmonary Nodules Detected on CT Images: From the Fleischner Society 2017;   Radiology 2017; 284:228-243. Electronically Signed   By: Jeffrey  Hu M.D.   On: 03/31/2019 09:37  ° °Dg Chest Portable 1 View ° °Result Date: 03/31/2019 °CLINICAL DATA:  Shortness of breath EXAM: PORTABLE CHEST 1 VIEW COMPARISON:  None. FINDINGS: Artifact from EKG leads. Normal heart size and mediastinal contours. No acute infiltrate or edema. No effusion or pneumothorax. No acute osseous findings. IMPRESSION: No active disease. Electronically Signed   By: Jonathon  Watts M.D.   On: 03/31/2019 05:08  ° °Ct Image Guided Drainage By Percutaneous Catheter ° °Result Date: 03/31/2019 °CLINICAL DATA:  Fever,  hepatic abscess EXAM: CT AND ULTRASOUND GUIDED DRAINAGE OF HEPATIC ABSCESS ANESTHESIA/SEDATION: Intravenous Fentanyl 2mcg and Versed 100mg were administered as conscious sedation during continuous monitoring of the patient's level of consciousness and physiological / cardiorespiratory status by the radiology RN, with a total moderate sedation time of 21 minutes. PROCEDURE: The procedure, risks, benefits, and alternatives were explained to the patient. Questions regarding the procedure were encouraged and answered. The patient understands and consents to the procedure. Limited axial scans through the liver obtained and the collection was again localized. The lesion could also be identified on ultrasound which allowed an advantageous subcostal approach. The operative field was prepped with chlorhexidinein a sterile fashion, and a sterile drape was applied covering the operative field. A sterile gown and sterile gloves were used for the procedure. Local anesthesia was provided with 1% Lidocaine. Under real-time ultrasound guidance, 18 gauge trocar needle advanced into the collection under ultrasound from a subcostal approach. Amplatz guidewire advanced easily within the collection. CT confirmed appropriate positioning. Tract dilated to facilitate placement of a 12 French pigtail drain catheter, formed centrally within the collection. 45 mL purulent material were aspirated, sample sent for Gram stain and culture. Catheter was secured externally with 0 Prolene suture and StatLock and placed to gravity drainage. The patient tolerated the procedure well. COMPLICATIONS: None immediate FINDINGS: Complex segment 8 hepatic abscess was again localized. 12 French pigtail drain catheter placed as above. 45 mL purulent material aspirated, sample sent for Gram stain and culture. IMPRESSION: 1. Technically successful CT and ultrasound-guided hepatic abscess drain catheter placement Electronically Signed   By: D  Hassell M.D.   On:  03/31/2019 16:08  ° °Us Ekg Site Rite ° °Result Date: 04/04/2019 °If Site Rite image not attached, placement could not be confirmed due to current cardiac rhythm. ° °Ir Radiologist Eval & Mgmt ° °Result Date: 04/11/2019 °Please refer to notes tab for details about interventional procedure. (Op Note) ° ° °Labs: ° °CBC: °Recent Labs  °  04/01/19 °0240 04/02/19 °0503 04/03/19 °0301 04/04/19 °0324  °WBC 7.8   7.8 11.0* 9.5 8.7  °HGB 10.8*   10.8* 10.5* 10.6* 11.4*  °HCT 31.5*   31.6* 30.8* 32.3* 34.1*  °PLT 336   328 349 399 507*  ° ° °COAGS: °Recent Labs  °  03/31/19 °1153  °INR 1.1  ° ° °BMP: °Recent Labs  °  04/01/19 °0240 04/02/19 °0503 04/03/19 °0301 04/04/19 °0818  °NA 127* 135 132* 134*  °K 3.2* 3.5 3.7 4.0  °CL 94* 100 96* 97*  °CO2 19* 24 23 25  °GLUCOSE 238* 207* 218* 210*  °BUN 12 11 12 9  °CALCIUM 7.8* 8.0* 7.9* 8.3*  °CREATININE 0.82 0.75 0.68 0.56*  °GFRNONAA >60 >60 >60 >60  °GFRAA >60 >60 >60 >60  ° ° °LIVER FUNCTION TESTS: °Recent Labs  °  03/31/19 °0530 04/01/19 °0240 04/03/19 °0301  °BILITOT 1.9* 1.0 0.6  °  AST 38 37 15  °ALT 56* 49* 29  °ALKPHOS 405* 305* 243*  °PROT 6.6 5.7* 5.6*  °ALBUMIN 2.5* 2.0* 1.9*  ° ° °TUMOR MARKERS: °No results for input(s): AFPTM, CEA, CA199, CHROMGRNA in the last 8760 hours. ° °Assessment and Plan: ° °Gary Bradshaw is 37 yo male with right liver abscess, 12F drain in place receiving rocephin ABX via PICC.    ° °The CT today shows residual collection, estimated 5.7cm diameter with internal septation, compared to prior baseline of 9cm.   ° °Drain in good position.  He and his girlfriend (translator) are here for follow up.  ° °Our discussion mostly was about his obstacles of care.  These include his living situation.  Briefly: °His GF is his only support in the area.  He has no family/friends (they are in [place]).  °She just graduated from [employer] and started medical school at [employer] in [place], [place].  She will be going there to start locally on 05/02/2019.  He will be going  with her.  Her family is in [place].  °They will be moving together to [place], [place], 05/02/2019.  He does not yet have doctors in [place].  ° °I suggest that she call ahead and get him PCP established, so that they can get appointment when they arrive.  She will need to take his records and his imaging with them.  ° °I encouraged them to observe Dr. Van Dam appointment on 04/27/2019.  ° °Plan: °- Repeat CT of abd/pelvis with contrast before the appointment with Dr. Van Dam.  Perhaps ~04/25/2019.  I would defer office visit, as the drain will likely be staying in.  If we see we can remove it and Dr. Van Dam agrees after he sees Gary Bradshaw, we can bring him in to remove. °- I have encouraged him to observe his office appointment with Dr. Van Dam 8/27 °- I have encouraged his GF/caretaker to seek PCP care for him in [place], [place].  See above discussion. °- Continue current care including flushes and recording output.  ° °  ° °Electronically Signed: °Jaime Wagner °04/11/2019, 12:04 PM ° ° °I spent a total of    25 Minutes in face to face in clinical consultation, greater than 50% of which was counseling/coordinating care for liver abscess, CT drainage.  ° °

## 2019-04-12 NOTE — Progress Notes (Signed)
Patient ID: Gary Bradshaw, male   DOB: [birth year], 38 y.o.   MRN: [id number]  Virtual Visit via Telephone Note  I connected with Gary Bradshaw on 04/13/19 at  1:50 PM EDT by telephone and verified that I am speaking with the correct person using two identifiers.   I discussed the limitations, risks, security and privacy concerns of performing an evaluation and management service by telephone and the availability of in person appointments. I also discussed with the patient that there may be a patient responsible charge related to this service. The patient expressed understanding and agreed to proceed.  Patient location:  Home  My Location:  [place] office Persons on the call: me, the patient, and his daughter   History of Present Illness: After hospitalization for hepatic abscess 7/31-04/05/2019.  Has f/up with GI and [facility] this month.  Blood sugars running 150-220.  Was 170 fasting this morning.    No fever.  No abdominal pain.  Appetite is good.  No N/V/D.  Bowels moving normally.  He is aware of f/up appts  From discharge summary: Disposition and follow-up:   [name] was [facility] from [facility] in Stable condition.  At the hospital follow up visit please address:  1.  Klebsiella bacteremia complicated by hepatic abscess. Ensure the patient has all the home health resources that he needs and follows up with infectious disease. Type II DM. Continue to escalate the patient's metformin dose. He should be able to come off the Lantus and be transitioned to all oral medications.   2.  Labs / imaging needed at time of follow-up: None  3.  Pending labs/ test needing follow-up: None     Observations/Objective:  A&Ox3   Assessment and Plan: 1. Type 2 diabetes mellitus with other specified complication, unspecified whether long term insulin use ([place]) Not controlled based on home readings- I have had a lengthy discussion and provided education about insulin resistance and the intake  of too much sugar/refined carbohydrates.  I have advised the patient to work at a goal of eliminating sugary drinks, candy, desserts, sweets, refined sugars, processed foods, and white carbohydrates.  The patient expresses understanding.  Increase dose metformin- - metFORMIN (GLUCOPHAGE-XR) 500 MG 24 hr tablet; Take 2 tablets (1,000 mg total) by mouth daily with breakfast.  Dispense: 180 tablet; Refill: 1 - Insulin Glargine (LANTUS) 100 UNIT/ML Solostar Pen; Inject 12 Units into the skin daily.  Dispense: 15 mL; Refill: 5  2. Liver mass, right lobe Keep appts with [facility]  3. Hospital discharge follow-up Doing well overall    Follow Up Instructions: Assign PCP 3-4 weeks;  Sooner if needed   I discussed the assessment and treatment plan with the patient. The patient was provided an opportunity to ask questions and all were answered. The patient agreed with the plan and demonstrated an understanding of the instructions.   The patient was advised to call back or seek an in-person evaluation if the symptoms worsen or if the condition fails to improve as anticipated.  I provided 12 minutes of non-face-to-face time during this encounter.   Freeman Caldron, PA-C

## 2019-04-13 ENCOUNTER — Ambulatory Visit: Payer: Self-pay | Attending: Family Medicine | Admitting: Physician Assistant

## 2019-04-13 ENCOUNTER — Other Ambulatory Visit: Payer: Self-pay

## 2019-04-13 DIAGNOSIS — Z09 Encounter for follow-up examination after completed treatment for conditions other than malignant neoplasm: Secondary | ICD-10-CM

## 2019-04-13 DIAGNOSIS — R160 Hepatomegaly, not elsewhere classified: Secondary | ICD-10-CM

## 2019-04-13 DIAGNOSIS — E1169 Type 2 diabetes mellitus with other specified complication: Secondary | ICD-10-CM

## 2019-04-13 MED ORDER — INSULIN GLARGINE 100 UNIT/ML SOLOSTAR PEN: 12.0000 [IU] | PEN_INJECTOR | Freq: Every day | SUBCUTANEOUS | 5 refills | Status: AC

## 2019-04-13 MED ORDER — METFORMIN HCL ER 500 MG PO TB24: 1000.0000 mg | ORAL_TABLET | Freq: Every day | ORAL | 1 refills | Status: AC

## 2019-04-18 ENCOUNTER — Other Ambulatory Visit (HOSPITAL_COMMUNITY): Payer: Self-pay | Admitting: Diagnostic Radiology

## 2019-04-18 DIAGNOSIS — K750 Abscess of liver: Secondary | ICD-10-CM

## 2019-04-21 ENCOUNTER — Other Ambulatory Visit: Payer: Self-pay

## 2019-04-21 ENCOUNTER — Ambulatory Visit (HOSPITAL_COMMUNITY)
Admission: RE | Admit: 2019-04-21 | Discharge: 2019-04-21 | Disposition: A | Discharge status: Home / Self Care | Payer: Self-pay | Source: Ambulatory Visit | Attending: Diagnostic Radiology | Admitting: Diagnostic Radiology

## 2019-04-21 DIAGNOSIS — K750 Abscess of liver: Secondary | ICD-10-CM | POA: Insufficient documentation

## 2019-04-21 MED ORDER — IOHEXOL 300 MG/ML  SOLN
100.0000 mL | Freq: Once | INTRAMUSCULAR | Status: AC | PRN
  Administered 2019-04-21: 100 mL via INTRAVENOUS

## 2019-04-21 MED ORDER — LIDOCAINE HCL 1 % IJ SOLN
INTRAMUSCULAR | Status: AC
  Filled 2019-04-21: qty 20

## 2019-04-25 ENCOUNTER — Other Ambulatory Visit: Payer: Self-pay

## 2019-04-27 ENCOUNTER — Encounter: Payer: Self-pay | Admitting: *Deleted

## 2019-04-27 ENCOUNTER — Ambulatory Visit
Admission: RE | Admit: 2019-04-27 | Discharge: 2019-04-27 | Disposition: A | Discharge status: Home / Self Care | Payer: Self-pay | Source: Ambulatory Visit | Attending: Infectious Disease | Admitting: Infectious Disease

## 2019-04-27 ENCOUNTER — Other Ambulatory Visit: Payer: Self-pay

## 2019-04-27 ENCOUNTER — Ambulatory Visit (INDEPENDENT_AMBULATORY_CARE_PROVIDER_SITE_OTHER): Payer: Self-pay | Admitting: Infectious Disease

## 2019-04-27 VITALS — BP 127/88 | HR 120 | Temp 98.0°F

## 2019-04-27 DIAGNOSIS — K750 Abscess of liver: Secondary | ICD-10-CM

## 2019-04-27 DIAGNOSIS — R7881 Bacteremia: Secondary | ICD-10-CM

## 2019-04-27 DIAGNOSIS — E1165 Type 2 diabetes mellitus with hyperglycemia: Secondary | ICD-10-CM | POA: Insufficient documentation

## 2019-04-27 HISTORY — DX: Type 2 diabetes mellitus with hyperglycemia: E11.65

## 2019-04-27 HISTORY — PX: IR RADIOLOGIST EVAL & MGMT: IMG5224

## 2019-04-27 MED ORDER — AMOXICILLIN-POT CLAVULANATE 875-125 MG PO TABS: 1.0000 | ORAL_TABLET | Freq: Two times a day (BID) | ORAL | 1 refills | Status: AC

## 2019-04-27 NOTE — Progress Notes (Signed)
Virtual Visit via Telephone Note  I connected with [name] on 04/27/19 at  9:00 AM EDT by telephone and verified that I am speaking with the correct person using two identifiers.  Location: Patient: [facility] Provider: Home   I discussed the limitations, risks, security and privacy concerns of performing an evaluation and management service by telephone and the availability of in person appointments. I also discussed with the patient that there may be a patient responsible charge related to this service. The patient expressed understanding and agreed to proceed.  Reason for infectious disease consult: Liver abscess and bacteremia with Klebsiella pneumonia  Requesting physician: Dr. Tarri Abernethy  History of Present Illness:  Mr Gary Bradshaw is a 38 year old Asian man who presented to [facility] with shortness of breath and fevers.  He also had abdominal pain.  He was found to have bacteremia with Klebsiella pneumoniae.  This Klebsiella was resistant to ampicillin but otherwise pansensitive.  It was found not only in the blood but also in his urine.  CT of the abdomen pelvis also revealed a complicated hepatic abscess.  Interventional radiology were able to drain this and again obtained 40 mL of purulent material.  These also grew the same Klebsiella pneumoniae species.  He was treated with intravenous ceftriaxone while in the hospital and discharged to home with a PICC line in place.  His plan of care was discussed with me by Dr. Tarri Abernethy and follow-up appointment was made for him to see me today formally for infectious disease consult.  I had needed to convert my clinic to a virtual 1 and had thought the patient would be seen by different provider but there were no other providers able to see the patient.  He was in the midst of getting ready to travel to [facility], with his daughter who is a Careers information officer.   Radiology perform follow-up CT scan which shows that the abscess is nearly  completely resolved.  They remove the drain.  The patient is doing quite well without fevers or systemic complaints no abdominal pain.  Dislikes having the PICC line in place and would like to have removed the stent as he can.  I would prefer however to extend his antimicrobial therapy with a month of Augmentin.  I have discussed the case with the patient and his daughter they were agreeable to this plan.   Vitals:   04/27/19 0934  BP: 127/88  Pulse: (!) 120  Temp: 98 F (36.7 C)      AS IN Hpi otherwise 12 pt ROS is negative  Past Medical History:  Diagnosis Date  . Diabetes mellitus without complication [facility])     Past Surgical History:  Procedure Laterality Date  . IR RADIOLOGIST EVAL & MGMT  04/11/2019  . IR RADIOLOGIST EVAL & MGMT  04/27/2019    No family history on file.    Social History   Socioeconomic History  . Marital status: Single    Spouse name: Not on file  . Number of children: Not on file  . Years of education: Not on file  . Highest education level: Not on file  Occupational History  . Not on file  Social Needs  . Financial resource strain: Not on file  . Food insecurity    Worry: Not on file    Inability: Not on file  . Transportation needs    Medical: Not on file    Non-medical: Not on file  Tobacco Use  . Smoking status:  Never Smoker  . Smokeless tobacco: Never Used  Substance and Sexual Activity  . Alcohol use: Not Currently  . Drug use: Not Currently  . Sexual activity: Not on file  Lifestyle  . Physical activity    Days per week: Not on file    Minutes per session: Not on file  . Stress: Not on file  Relationships  . Social Herbalist on phone: Not on file    Gets together: Not on file    Attends religious service: Not on file    Active member of club or organization: Not on file    Attends meetings of clubs or organizations: Not on file    Relationship status: Not on file  Other Topics Concern  . Not on file   Social History Narrative  . Not on file    No Known Allergies   Current Outpatient Medications:  .  acetaminophen (TYLENOL) 500 MG tablet, Take 500-1,000 mg by mouth every 6 (six) hours as needed for fever., Disp: , Rfl:  .  cefTRIAXone (ROCEPHIN) IVPB, Inject 2 g into the vein daily for 24 days. Indication:  Hepatitis abscess Last Day of Therapy:  04/28/2019 Labs - Once weekly:  CBC/D and BMP, Labs - Every other week:  ESR and CRP, Disp: 24 Units, Rfl: 0 .  Insulin Glargine (LANTUS) 100 UNIT/ML Solostar Pen, Inject 12 Units into the skin daily., Disp: 15 mL, Rfl: 5 .  metFORMIN (GLUCOPHAGE-XR) 500 MG 24 hr tablet, Take 2 tablets (1,000 mg total) by mouth daily with breakfast., Disp: 180 tablet, Rfl: 1    Observations/Objective:  Mr. Sayas seems to be doing well having nearly completed the course of ceftriaxone and having had his drain removed for his liver abscess.  I suspect that he had a GI source of his infection and that the finding of the Klebsiella in the urine was made nearly a matter of the urine having been seeded by his bacteremia.  I would like to extend his antimicrobial therapy as above  Assessment and Plan:  Pyogenic liver abscess with Klebsiella pneumonia and bacteremia:  [facility] will have [facility] line and he will complete a course of 30 days of Augmentin.  I sent prescription into local [facility] pharmacy.  I will also schedule follow-up appointment virtually with myself since he does not have a provider in [place] at this point.    Follow Up Instructions:    I discussed the assessment and treatment plan with the patient. The patient was provided an opportunity to ask questions and all were answered. The patient agreed with the plan and demonstrated an understanding of the instructions.   The patient was advised to call back or seek an in-person evaluation if the symptoms worsen or if the condition fails to improve as anticipated.  I  provided 30 minutes of non-face-to-face time during this encounter.   Alcide Evener, MD

## 2019-04-27 NOTE — Progress Notes (Signed)
Called [facility] and spoke with Coretta Made aware of new orders per Dr. Tommy Medal to pull picc line today. Vebal orders read back and understood. Patient will transition to oral antibiotics and Dr. Tommy Medal has already scheduled next evisit follow up appointment. [name]

## 2019-04-27 NOTE — Progress Notes (Signed)
Chief Complaint: Patient was seen in consultation today for  at the request of Lucianne Rasheem Dam,Cornelius N  Referring Physician(s): Lucianne Skylur Dam,Cornelius N  History of Present Illness: Gary Bradshaw is a 38 y.o. male With a history of hepatic abscess status post placement of a percutaneous drainage catheter on 03/31/2019.  He presents today for follow-up and potential drain removal.  His girlfriend is with him who serves as his a Optometrist.  His 12 French pigtail drainage catheter remains to gravity.  He has been receiving his intravenous antibiotics and still has a PIC in his right upper extremity.  Drain output has been minimal.  He is feeling much better.  Recent prior CT imaging demonstrated near total resolution of the hepatic abscess.  Past Medical History:  Diagnosis Date   Diabetes mellitus without complication [facility])     Past Surgical History:  Procedure Laterality Date   IR RADIOLOGIST EVAL & MGMT  04/11/2019    Allergies: Patient has no known allergies.  Medications: Prior to Admission medications   Medication Sig Start Date End Date Taking? Authorizing Provider  acetaminophen (TYLENOL) 500 MG tablet Take 500-1,000 mg by mouth every 6 (six) hours as needed for fever.    [provider]  cefTRIAXone (ROCEPHIN) IVPB Inject 2 g into the vein daily for 24 days. Indication:  Hepatitis abscess Last Day of Therapy:  04/28/2019 Labs - Once weekly:  CBC/D and BMP, Labs - Every other week:  ESR and CRP 04/05/19 04/29/19  Jose Persia, MD  Insulin Glargine (LANTUS) 100 UNIT/ML Solostar Pen Inject 12 Units into the skin daily. 04/13/19   Argentina Donovan, PA-C  metFORMIN (GLUCOPHAGE-XR) 500 MG 24 hr tablet Take 2 tablets (1,000 mg total) by mouth daily with breakfast. 04/13/19   Argentina Donovan, PA-C     No family history on file.  Social History   Socioeconomic History   Marital status: Single    Spouse name: Not on file   Number of children: Not on file   Years of  education: Not on file   Highest education level: Not on file  Occupational History   Not on file  Social Needs   Financial resource strain: Not on file   Food insecurity    Worry: Not on file    Inability: Not on file   Transportation needs    Medical: Not on file    Non-medical: Not on file  Tobacco Use   Smoking status: Never Smoker   Smokeless tobacco: Never Used  Substance and Sexual Activity   Alcohol use: Not Currently   Drug use: Not Currently   Sexual activity: Not on file  Lifestyle   Physical activity    Days per week: Not on file    Minutes per session: Not on file   Stress: Not on file  Relationships   Social connections    Talks on phone: Not on file    Gets together: Not on file    Attends religious service: Not on file    Active member of club or organization: Not on file    Attends meetings of clubs or organizations: Not on file    Relationship status: Not on file  Other Topics Concern   Not on file  Social History Narrative   Not on file      Review of Systems: A 12 point ROS discussed and pertinent positives are indicated in the HPI above.  All other systems are negative.  Review of Systems  Vital Signs: BP (!) 131/94 (BP Location: Left Arm)    Pulse (!) 114    Temp 97.7 F (36.5 C)    SpO2 96%   Physical Exam Vitals signs reviewed.  Constitutional:      General: He is not in acute distress.    Appearance: Normal appearance. He is normal weight. He is not ill-appearing.  HENT:     Head: Normocephalic and atraumatic.  Eyes:     General: No scleral icterus. Cardiovascular:     Rate and Rhythm: Normal rate.  Pulmonary:     Effort: Pulmonary effort is normal.  Abdominal:     General: Abdomen is flat. There is no distension.     Palpations: Abdomen is soft.     Tenderness: There is no abdominal tenderness.     Comments: RLQ drain in place and intact.  Dressing C/D/I.  No erythema   Neurological:     Mental Status: He is  alert.      Imaging: Ct Angio Chest Pe W And/or Wo Contrast  Result Date: 03/31/2019 CLINICAL DATA:  38 year old male with shortness of breath, fever, abdominal pain and nausea for 2 weeks. EXAM: CT ANGIOGRAPHY CHEST CT ABDOMEN AND PELVIS WITH CONTRAST TECHNIQUE: Multidetector CT imaging of the chest was performed using the standard protocol during bolus administration of intravenous contrast. Multiplanar CT image reconstructions and MIPs were obtained to evaluate the vascular anatomy. Multidetector CT imaging of the abdomen and pelvis was performed using the standard protocol during bolus administration of intravenous contrast. CONTRAST:  179m OMNIPAQUE IOHEXOL 350 MG/ML SOLN COMPARISON:  None. FINDINGS: CTA CHEST FINDINGS Cardiovascular: This is a technically satisfactory study. There is no evidence of pulmonary emboli. Heart size normal. No evidence of thoracic aortic aneurysm or dissection. No pericardial effusion. Mediastinum/Nodes: No enlarged mediastinal, hilar, or axillary lymph nodes. Thyroid gland, trachea, and esophagus demonstrate no significant findings. Lungs/Pleura: An 8 mm noncalcified nodule at the LEFT LOWER lobe base is identified (Series 8: Image 71). The lungs are otherwise clear. No airspace disease, consolidation, mass, pleural effusion or pneumothorax. Musculoskeletal: No acute or suspicious bony abnormalities identified. Review of the MIP images confirms the above findings. CT ABDOMEN and PELVIS FINDINGS Hepatobiliary: A 6 x 9 x 8 cm cystic lesion within the RIGHT liver contains multiple septations. This is nonspecific but is suspicious for abscess given patient's symptoms. The remainder of the liver and gallbladder are unremarkable. No biliary dilatation. Pancreas: Unremarkable Spleen: Unremarkable Adrenals/Urinary Tract: Subtle wedge-shaped areas of differential attenuation within the kidneys are noted and could represent pyelonephritis. No other renal abnormalities identified.  The adrenal glands and bladder are unremarkable. Stomach/Bowel: Stomach is within normal limits. No evidence of bowel wall thickening, distention, or inflammatory changes. Vascular/Lymphatic: No significant vascular findings are present. No enlarged abdominal or pelvic lymph nodes. Reproductive: Heterogeneous enlargement of the RIGHT prostate. Other: No ascites, pneumoperitoneum or abdominal wall hernia. Musculoskeletal: No acute or suspicious bony abnormalities. Review of the MIP images confirms the above findings. IMPRESSION: 1. 6 x 9 x 8 cm RIGHT hepatic complex cystic lesion, likely representing an abscess given patient's clinical history. 2. Subtle wedge-shaped areas of differential perfusion within both kidneys which could represent pyelonephritis. No evidence of renal abscess. 3. Nonspecific heterogeneous enlargement of the RIGHT prostate. Correlate clinically. 4. 8 mm LEFT LOWER lobe pulmonary nodule. Non-contrast chest CT at 6-12 months is recommended. If the nodule is stable at time of repeat CT, then future CT at 18-24 months (from today's scan) is considered  optional for low-risk patients, but is recommended for high-risk patients. This recommendation follows the consensus statement: Guidelines for Management of Incidental Pulmonary Nodules Detected on CT Images: From the Fleischner Society 2017; Radiology 2017; 284:228-243. Electronically Signed   By: Margarette Canada M.D.   On: 03/31/2019 09:37   Ct Abdomen Pelvis W Contrast  Addendum Date: 04/21/2019   ADDENDUM REPORT: 04/21/2019 10:16 ADDENDUM: Correction: Mr. Weimar was accompanied by his girlfriend, not daughter, who is helping take care of the drain. He will be moving with her when she starts in-person medical school. Electronically Signed   By: Aletta Edouard M.D.   On: 04/21/2019 10:16   Addendum Date: 04/21/2019   ADDENDUM REPORT: 04/21/2019 10:10 ADDENDUM: Mr. Rodino was seen after the CT study to determine need for possible drain exchange.  Review of the CT shows stable drain positioning within the superior aspect of the right lobe of the liver. Surrounding liquefied abscess has clearly improved since the prior CT of 04/11/2019 with decreased liquefied pocket present at the level of the drainage catheter and anterior to the drain. Satellite pocket in the anterior right lobe has diminished from approximately 2.5 cm to 1.1 cm. There is no significant fluid around the pigtail portion of the drain currently compared to the previous scan demonstrating clear fluid and air. Commensurate to this, there has clinically been significant diminishment in output from the drain which has now been less than 10 mL for the last several days. The patient denies fever. He has had minimal discomfort in the region of the liver. The patient's daughter has been flushing the drain with 5 mL of saline daily and has noted significant diminishing return. Mr. Litton remains on antibiotics and has an appointment with Dr. Tommy Medal with ID on Thursday, 8/27. Given clear improvement by CT and significant diminished return of fluid from the drain, he will be set up for drain removal on Thursday after his appointment with Dr. Tommy Medal in our clinic. He is leaving town the weekend of 8/29 with his daughter who is a Careers information officer at [employer] and is seeking new medical care there when he moves there. Electronically Signed   By: Aletta Edouard M.D.   On: 04/21/2019 10:10   Result Date: 04/21/2019 CLINICAL DATA:  Follow-up hepatic abscess EXAM: CT ABDOMEN AND PELVIS WITH CONTRAST TECHNIQUE: Multidetector CT imaging of the abdomen and pelvis was performed using the standard protocol following bolus administration of intravenous contrast. CONTRAST:  1108m OMNIPAQUE IOHEXOL 300 MG/ML  SOLN COMPARISON:  04/11/2019 FINDINGS: Lower chest: Minimal dependent atelectasis in the lower lobes. No effusions. Hepatobiliary: Pigtail drainage catheter remains in the right hepatic lobe. Complex  multiloculated fluid collection cysts within the right hepatic lobe with greatest diameter approximately 6.4 cm. This is similar to prior study. Gallbladder unremarkable. Pancreas: No focal abnormality or ductal dilatation. Spleen: No focal abnormality.  Normal size. Adrenals/Urinary Tract: No adrenal abnormality. No focal renal abnormality. No stones or hydronephrosis. Urinary bladder is unremarkable. Stomach/Bowel: Stomach, large and small bowel grossly unremarkable. Vascular/Lymphatic: No evidence of aneurysm or adenopathy. Reproductive: No visible focal abnormality. Other: No free fluid or free air. Musculoskeletal: No acute bony abnormality. IMPRESSION: Multiloculated right hepatic abscess again noted with pigtail drainage catheter in stable position. Overall appearance in size not significantly changed since prior study. Electronically Signed: By: KRolm BaptiseM.D. On: 04/21/2019 09:26   Ct Abdomen Pelvis W Contrast  Result Date: 04/11/2019 CLINICAL DATA:  38year old male with a history of liver  abscess, previous CT-guided drainage March 31, 2019 EXAM: CT ABDOMEN AND PELVIS WITH CONTRAST TECHNIQUE: Multidetector CT imaging of the abdomen and pelvis was performed using the standard protocol following bolus administration of intravenous contrast. CONTRAST:  151m ISOVUE-300 IOPAMIDOL (ISOVUE-300) INJECTION 61% COMPARISON:  Baseline CT March 31, 2019, CT drainage March 31, 2019 FINDINGS: Lower chest: Trace right-sided pleural effusion with atelectasis. Nodule at the left lung base which was present on the prior chest CT is decreasing in size. This was previously 8 mm currently 4 mm. Given the significant size decrease this is most likely inflammatory. Hepatobiliary: Pigtail drainage catheter centered within abscess of the right liver. There is persisting fluid at the site with multiple internal septations, present on the comparison. The prior maximum diameter was 9 cm. Greatest diameter currently approximately  6.0 cm. No additional sites of abscess identified. There is trace fluid at the margin of the drain as it exits the anterior level capsule. Unremarkable gallbladder Pancreas: Unremarkable Spleen: Unremarkable Adrenals/Urinary Tract: Unremarkable appearance of the adrenal glands. No evidence of hydronephrosis of the right or left kidney. Improved appearance of bilateral wedge-shaped regions of the bilateral kidneys, though not altogether resolved. No nephrolithiasis. Unremarkable course of the bilateral ureters. Unremarkable appearance of the urinary bladder. Stomach/Bowel: Unremarkable stomach. Unremarkable small bowel. No appendix visualized. Mild stool burden. No focal inflammatory changes of the colon. No significant diverticular disease. Vascular/Lymphatic: Unremarkable vasculature with no atherosclerosis. Small lymph nodes in the liver hilum. No retroperitoneal or mesenteric adenopathy. Reproductive: Unremarkable Other: No anterior/ventral wall hernia. Musculoskeletal: No displaced fracture. IMPRESSION: Percutaneous pigtail drainage catheter within right liver abscess. The abscess is improved though persists, with prior diameter 9 cm, current maximum diameter 6 cm with persisting internal septation/complexity. Trace right-sided pleural effusion, likely sympathetic effusion. Improving appearance of the kidneys, with partial resolution of the previous low-density/hypoenhancing regions of the cortex. This may reflect partially resolved pyelonephritis and/or ongoing pyelonephritis. Decreasing size of left lung base pulmonary nodule, indicating inflammatory/infectious etiology. Electronically Signed   By: JCorrie MckusickD.O.   On: 04/11/2019 13:42   Ct Abdomen Pelvis W Contrast  Result Date: 03/31/2019 CLINICAL DATA:  38year old male with shortness of breath, fever, abdominal pain and nausea for 2 weeks. EXAM: CT ANGIOGRAPHY CHEST CT ABDOMEN AND PELVIS WITH CONTRAST TECHNIQUE: Multidetector CT imaging of the  chest was performed using the standard protocol during bolus administration of intravenous contrast. Multiplanar CT image reconstructions and MIPs were obtained to evaluate the vascular anatomy. Multidetector CT imaging of the abdomen and pelvis was performed using the standard protocol during bolus administration of intravenous contrast. CONTRAST:  1055mOMNIPAQUE IOHEXOL 350 MG/ML SOLN COMPARISON:  None. FINDINGS: CTA CHEST FINDINGS Cardiovascular: This is a technically satisfactory study. There is no evidence of pulmonary emboli. Heart size normal. No evidence of thoracic aortic aneurysm or dissection. No pericardial effusion. Mediastinum/Nodes: No enlarged mediastinal, hilar, or axillary lymph nodes. Thyroid gland, trachea, and esophagus demonstrate no significant findings. Lungs/Pleura: An 8 mm noncalcified nodule at the LEFT LOWER lobe base is identified (Series 8: Image 71). The lungs are otherwise clear. No airspace disease, consolidation, mass, pleural effusion or pneumothorax. Musculoskeletal: No acute or suspicious bony abnormalities identified. Review of the MIP images confirms the above findings. CT ABDOMEN and PELVIS FINDINGS Hepatobiliary: A 6 x 9 x 8 cm cystic lesion within the RIGHT liver contains multiple septations. This is nonspecific but is suspicious for abscess given patient's symptoms. The remainder of the liver and gallbladder are unremarkable. No biliary dilatation. Pancreas:  Unremarkable Spleen: Unremarkable Adrenals/Urinary Tract: Subtle wedge-shaped areas of differential attenuation within the kidneys are noted and could represent pyelonephritis. No other renal abnormalities identified. The adrenal glands and bladder are unremarkable. Stomach/Bowel: Stomach is within normal limits. No evidence of bowel wall thickening, distention, or inflammatory changes. Vascular/Lymphatic: No significant vascular findings are present. No enlarged abdominal or pelvic lymph nodes. Reproductive:  Heterogeneous enlargement of the RIGHT prostate. Other: No ascites, pneumoperitoneum or abdominal wall hernia. Musculoskeletal: No acute or suspicious bony abnormalities. Review of the MIP images confirms the above findings. IMPRESSION: 1. 6 x 9 x 8 cm RIGHT hepatic complex cystic lesion, likely representing an abscess given patient's clinical history. 2. Subtle wedge-shaped areas of differential perfusion within both kidneys which could represent pyelonephritis. No evidence of renal abscess. 3. Nonspecific heterogeneous enlargement of the RIGHT prostate. Correlate clinically. 4. 8 mm LEFT LOWER lobe pulmonary nodule. Non-contrast chest CT at 6-12 months is recommended. If the nodule is stable at time of repeat CT, then future CT at 18-24 months (from today's scan) is considered optional for low-risk patients, but is recommended for high-risk patients. This recommendation follows the consensus statement: Guidelines for Management of Incidental Pulmonary Nodules Detected on CT Images: From the Fleischner Society 2017; Radiology 2017; 284:228-243. Electronically Signed   By: Margarette Canada M.D.   On: 03/31/2019 09:37   Dg Chest Portable 1 View  Result Date: 03/31/2019 CLINICAL DATA:  Shortness of breath EXAM: PORTABLE CHEST 1 VIEW COMPARISON:  None. FINDINGS: Artifact from EKG leads. Normal heart size and mediastinal contours. No acute infiltrate or edema. No effusion or pneumothorax. No acute osseous findings. IMPRESSION: No active disease. Electronically Signed   By: Monte Fantasia M.D.   On: 03/31/2019 05:08   Ct Image Guided Drainage By Percutaneous Catheter  Result Date: 03/31/2019 CLINICAL DATA:  Fever, hepatic abscess EXAM: CT AND ULTRASOUND GUIDED DRAINAGE OF HEPATIC ABSCESS ANESTHESIA/SEDATION: Intravenous Fentanyl 83mg and Versed 1035mwere administered as conscious sedation during continuous monitoring of the patient's level of consciousness and physiological / cardiorespiratory status by the  radiology RN, with a total moderate sedation time of 21 minutes. PROCEDURE: The procedure, risks, benefits, and alternatives were explained to the patient. Questions regarding the procedure were encouraged and answered. The patient understands and consents to the procedure. Limited axial scans through the liver obtained and the collection was again localized. The lesion could also be identified on ultrasound which allowed an advantageous subcostal approach. The operative field was prepped with chlorhexidinein a sterile fashion, and a sterile drape was applied covering the operative field. A sterile gown and sterile gloves were used for the procedure. Local anesthesia was provided with 1% Lidocaine. Under real-time ultrasound guidance, 18 gauge trocar needle advanced into the collection under ultrasound from a subcostal approach. Amplatz guidewire advanced easily within the collection. CT confirmed appropriate positioning. Tract dilated to facilitate placement of a 12 French pigtail drain catheter, formed centrally within the collection. 45 mL purulent material were aspirated, sample sent for Gram stain and culture. Catheter was secured externally with 0 Prolene suture and StatLock and placed to gravity drainage. The patient tolerated the procedure well. COMPLICATIONS: None immediate FINDINGS: Complex segment 8 hepatic abscess was again localized. 12 French pigtail drain catheter placed as above. 45 mL purulent material aspirated, sample sent for Gram stain and culture. IMPRESSION: 1. Technically successful CT and ultrasound-guided hepatic abscess drain catheter placement Electronically Signed   By: D Lucrezia Europe.D.   On: 03/31/2019 16:08   Us[place]g  Site Rite  Result Date: 04/04/2019 If Site Rite image not attached, placement could not be confirmed due to current cardiac rhythm.  Ir Radiologist Eval & Mgmt  Result Date: 04/11/2019 Please refer to notes tab for details about interventional procedure. (Op  Note)   Labs:  CBC: Recent Labs    04/01/19 0240 04/02/19 0503 04/03/19 0301 04/04/19 0324  WBC 7.8   7.8 11.0* 9.5 8.7  HGB 10.8*   10.8* 10.5* 10.6* 11.4*  HCT 31.5*   31.6* 30.8* 32.3* 34.1*  PLT 336   328 349 399 507*    COAGS: Recent Labs    03/31/19 1153  INR 1.1    BMP: Recent Labs    04/01/19 0240 04/02/19 0503 04/03/19 0301 04/04/19 0818  NA 127* 135 132* 134*  K 3.2* 3.5 3.7 4.0  CL 94* 100 96* 97*  CO2 19* '24 23 25  ' GLUCOSE 238* 207* 218* 210*  BUN '12 11 12 9  ' CALCIUM 7.8* 8.0* 7.9* 8.3*  CREATININE 0.82 0.75 0.68 0.56*  GFRNONAA >60 >60 >60 >60  GFRAA >60 >60 >60 >60    LIVER FUNCTION TESTS: Recent Labs    03/31/19 0530 04/01/19 0240 04/03/19 0301  BILITOT 1.9* 1.0 0.6  AST 38 37 15  ALT 56* 49* 29  ALKPHOS 405* 305* 243*  PROT 6.6 5.7* 5.6*  ALBUMIN 2.5* 2.0* 1.9*    TUMOR MARKERS: No results for input(s): AFPTM, CEA, CA199, CHROMGRNA in the last 8760 hours.  Assessment and Plan:  Essentially resolved hepatic abscess.  The drainage catheter was removed without issue.   Electronically Signed: Jacqulynn Cadet 04/27/2019, 9:15 AM   I spent a total of 10 Minutes in face to face in clinical consultation, greater than 50% of which was counseling/coordinating care for hepatic abscess status post percutaneous drainage.

## 2019-05-05 ENCOUNTER — Telehealth: Payer: Self-pay

## 2019-05-05 DIAGNOSIS — R7881 Bacteremia: Secondary | ICD-10-CM

## 2019-05-05 DIAGNOSIS — B961 Klebsiella pneumoniae [K. pneumoniae] as the cause of diseases classified elsewhere: Secondary | ICD-10-CM

## 2019-05-05 NOTE — Telephone Encounter (Signed)
Received call from June on behalf of patient Gary Bradshaw. States patient has wide spread red/itchy rash to bilateral arms and legs. Rash noted today. Patient currently taking Augmentin. Routing to provider for advise.  [name]

## 2019-05-09 MED ORDER — LEVOFLOXACIN 500 MG PO TABS: 500.0000 mg | ORAL_TABLET | Freq: Every day | ORAL | 0 refills | Status: AC

## 2019-05-09 NOTE — Telephone Encounter (Signed)
Outgoing call placed to Gary Bradshaw. Daughter states rash is now better. Advised Gary Bradshaw per Dr. Lucianne Rayon to continue the Augmentin, but if it worsens to switch to Levaquin. Gary Bradshaw verbalized understanding of Dr. Lucianne Uchenna Dam's recommendation. [name]

## 2019-05-09 NOTE — Addendum Note (Signed)
Addended by: Reggy Eye on: 05/09/2019 12:31 PM   Modules accepted: Orders

## 2019-05-09 NOTE — Telephone Encounter (Signed)
Lets have him STOP augmentin and enter this rash into allergy section. He should switch to levaquin 500mg  daily #30 day supply for now

## 2019-05-09 NOTE — Telephone Encounter (Signed)
Called patient's daughter (Gary Bradshaw) left voicemail stating Call your doctors office in regards to the antibiotics" [name]

## 2019-05-09 NOTE — Telephone Encounter (Addendum)
Spoke with daughter and advised of medication change and sent to pharmacy. She reports the rash is better and he never stopped taking the Augmentin. Advised will let the provider know but the switch is still needed. Reminded of follow up appt. Patient is on [place] at this time.

## 2019-05-09 NOTE — Telephone Encounter (Signed)
If the rash is better he can continue the augmentin but if it worsens he should switch

## 2019-05-10 NOTE — Telephone Encounter (Signed)
thx so much Shaquenia! 

## 2019-05-24 ENCOUNTER — Ambulatory Visit: Payer: Self-pay | Admitting: Infectious Disease

## 2021-03-31 IMAGING — CT CT ABDOMEN AND PELVIS WITH CONTRAST
2 of 12 series · 11 of 46 positions shown, 17 images · IV contrast (APPLIED)
Comparison: None.

CLINICAL DATA: 37-year-old male with shortness of breath, fever,
abdominal pain and nausea for 2 weeks.

EXAM:
CT ANGIOGRAPHY CHEST
CT ABDOMEN AND PELVIS WITH CONTRAST
TECHNIQUE: Multidetector CT imaging of the chest was performed using the
standard protocol during bolus administration of intravenous
contrast. Multiplanar CT image reconstructions and MIPs were
obtained to evaluate the vascular anatomy. Multidetector CT imaging
of the abdomen and pelvis was performed using the standard protocol
during bolus administration of intravenous contrast.
CONTRAST:  100mL OMNIPAQUE IOHEXOL 350 MG/ML SOLN

[Series 9: coronal mpr · coronal · 0.54mm/px · 1 of 80 slices shown, 2 images]
[im 40/80  soft-tissue]
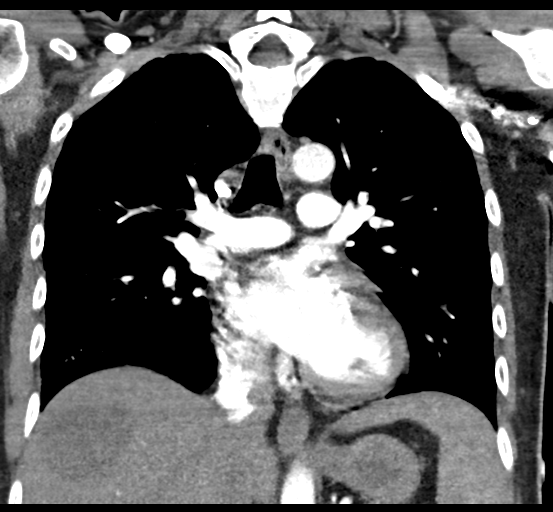
[im 40/80  bone]
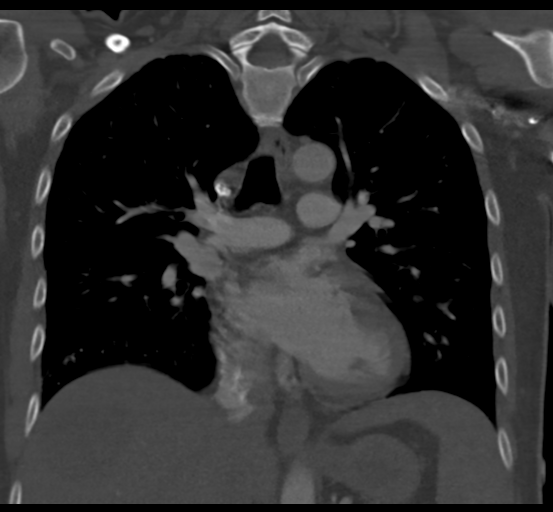

[Series 14: thins · axial · 0.71mm/px · z∈[+705,+1128]mm · 10 of 489 slices shown, 15 images]
[im 33/489  soft-tissue]
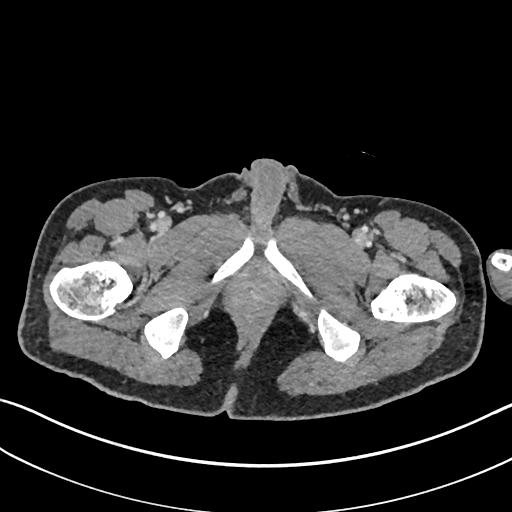
[im 33/489  bone]
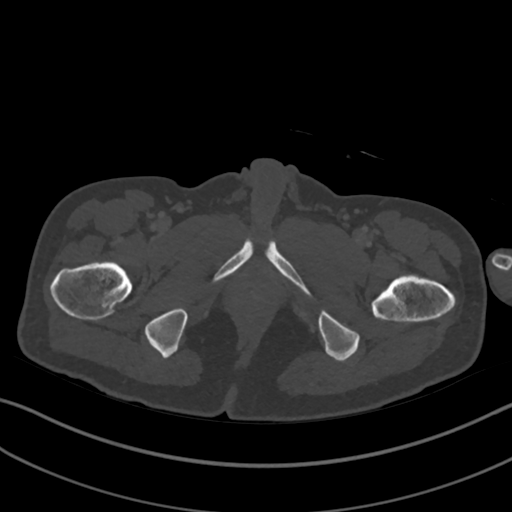
[im 98/489  soft-tissue]
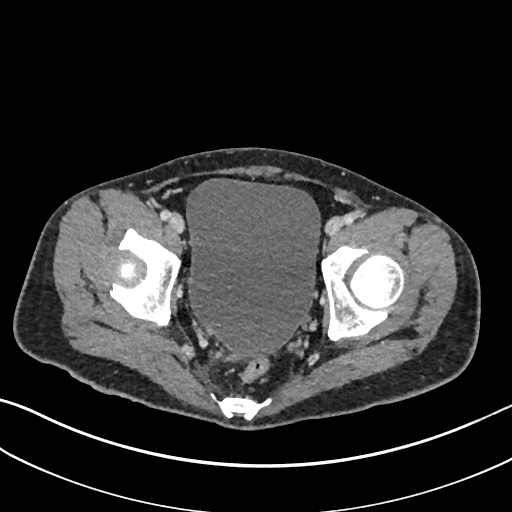
[im 131/489  soft-tissue]
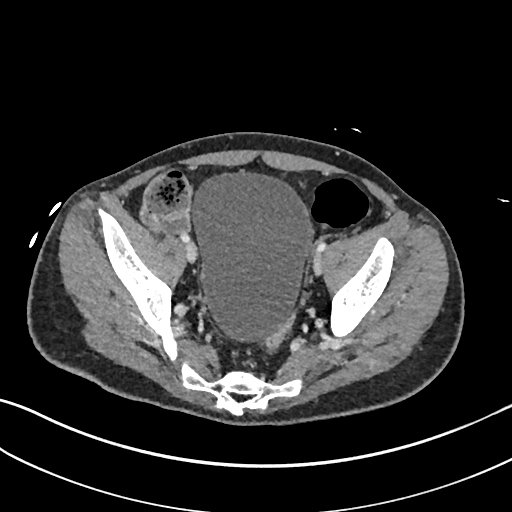
[im 196/489  soft-tissue]
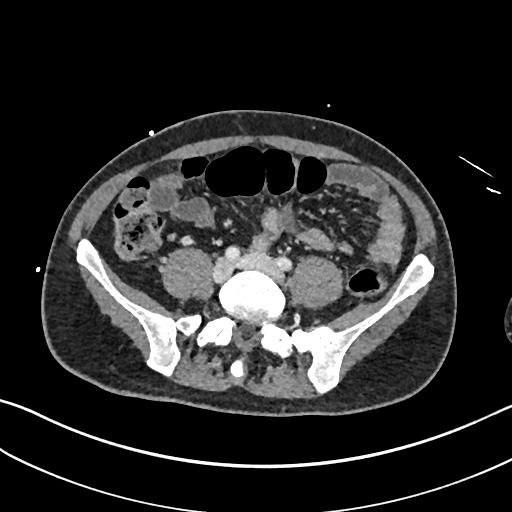
[im 261/489  soft-tissue]
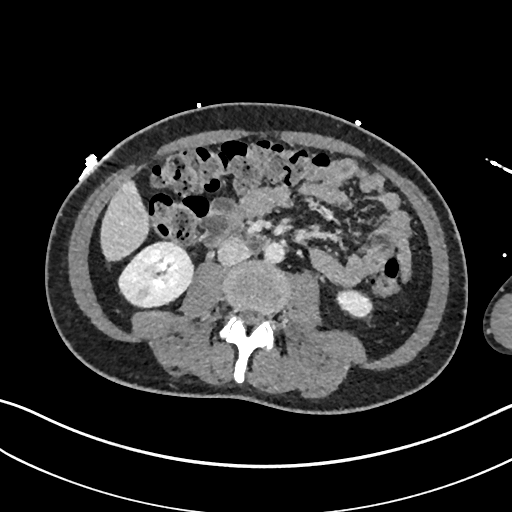
[im 293/489  soft-tissue]
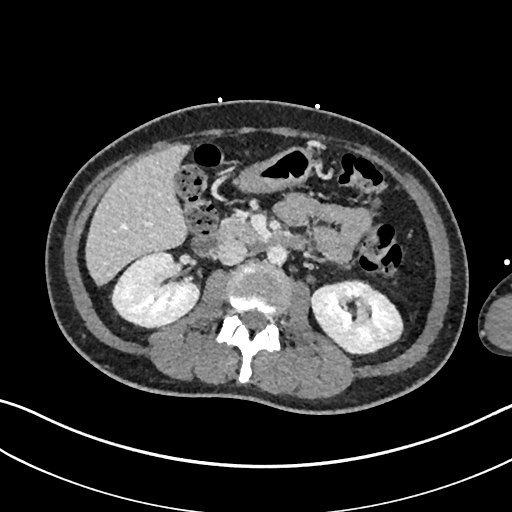
[im 358/489  soft-tissue]
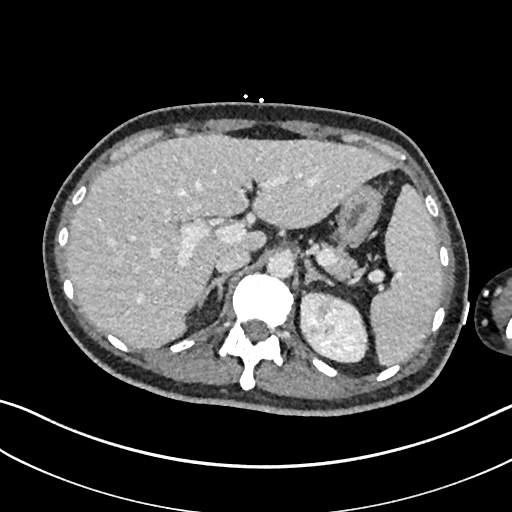
[im 358/489  lung]
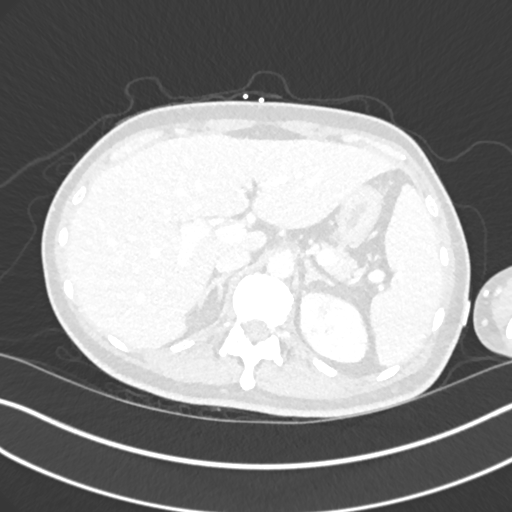
[im 391/489  soft-tissue]
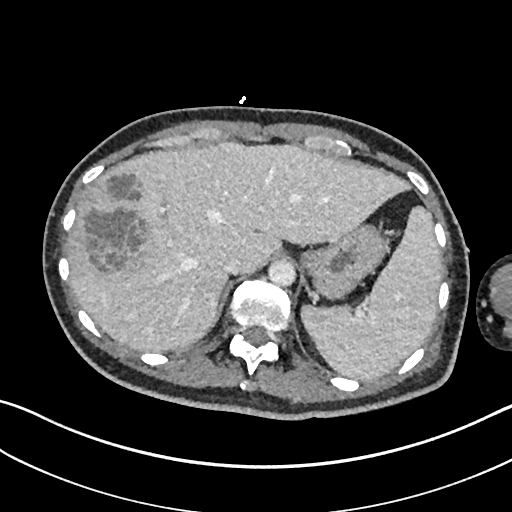
[im 391/489  lung]
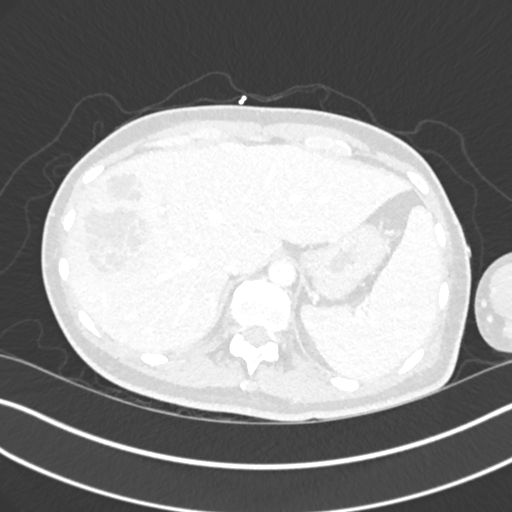
[im 423/489  lung]
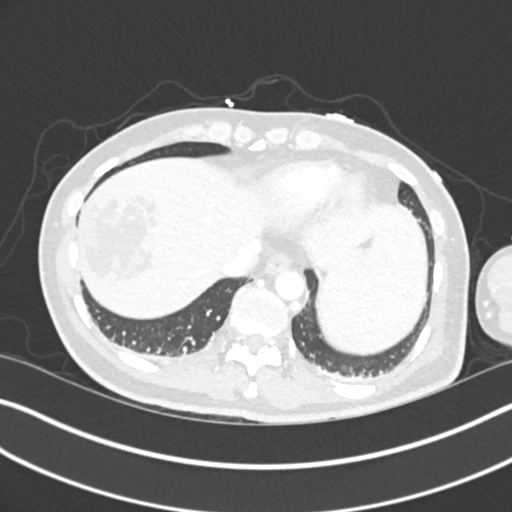
[im 456/489  soft-tissue]
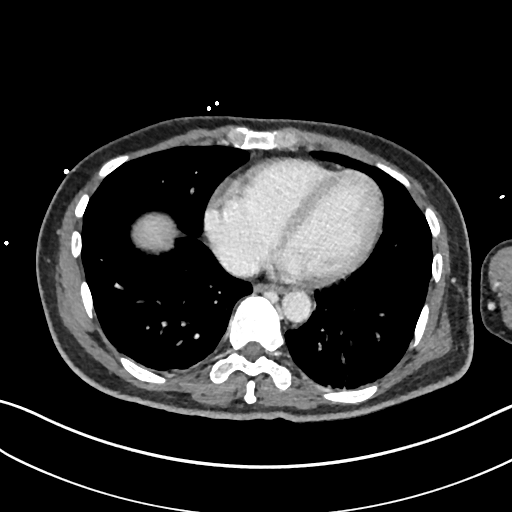
[im 456/489  lung]
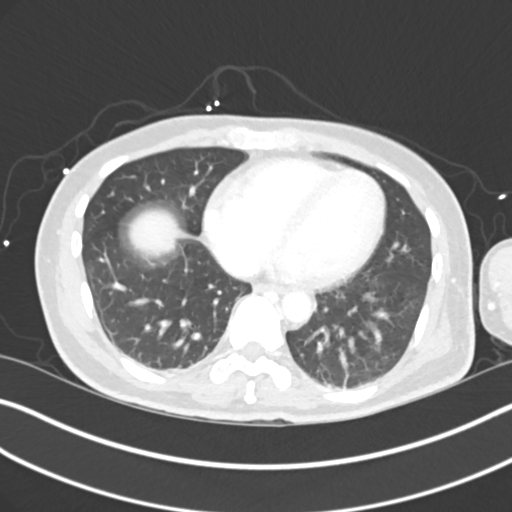
[im 456/489  bone]
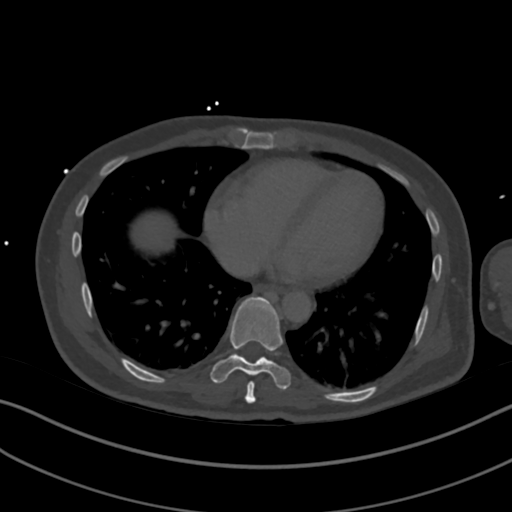

[11 of 46 positions shown; findings below may reference images not displayed]

FINDINGS: CTA CHEST FINDINGS

Cardiovascular: This is a technically satisfactory study. There is
no evidence of pulmonary emboli. Heart size normal. No evidence of
thoracic aortic aneurysm or dissection. No pericardial effusion.

Mediastinum/Nodes: No enlarged mediastinal, hilar, or axillary lymph
nodes. Thyroid gland, trachea, and esophagus demonstrate no
significant findings.

Lungs/Pleura: An 8 mm noncalcified nodule at the LEFT LOWER lobe
base is identified (Series 8: Image 71). The lungs are otherwise
clear. No airspace disease, consolidation, mass, pleural effusion or
pneumothorax.

Musculoskeletal: No acute or suspicious bony abnormalities
identified.

Review of the MIP images confirms the above findings.

CT ABDOMEN and PELVIS FINDINGS

Hepatobiliary: A 6 x 9 x 8 cm cystic lesion within the RIGHT liver
contains multiple septations. This is nonspecific but is suspicious
for abscess given patient's symptoms.

The remainder of the liver and gallbladder are unremarkable. No
biliary dilatation.

Pancreas: Unremarkable

Spleen: Unremarkable

Adrenals/Urinary Tract: Subtle wedge-shaped areas of differential
attenuation within the kidneys are noted and could represent
pyelonephritis. No other renal abnormalities identified. The adrenal
glands and bladder are unremarkable.

Stomach/Bowel: Stomach is within normal limits. No evidence of bowel
wall thickening, distention, or inflammatory changes.

Vascular/Lymphatic: No significant vascular findings are present. No
enlarged abdominal or pelvic lymph nodes.

Reproductive: Heterogeneous enlargement of the RIGHT prostate.

Other: No ascites, pneumoperitoneum or abdominal wall hernia.

Musculoskeletal: No acute or suspicious bony abnormalities.

Review of the MIP images confirms the above findings.
IMPRESSION: 1. 6 x 9 x 8 cm RIGHT hepatic complex cystic lesion, likely
representing an abscess given patient's clinical history.
2. Subtle wedge-shaped areas of differential perfusion within both
kidneys which could represent pyelonephritis. No evidence of renal
abscess.
3. Nonspecific heterogeneous enlargement of the RIGHT prostate.
Correlate clinically.
4. 8 mm LEFT LOWER lobe pulmonary nodule. Non-contrast chest CT at
6-12 months is recommended. If the nodule is stable at time of
repeat CT, then future CT at 18-24 months (from today's scan) is
considered optional for low-risk patients, but is recommended for
high-risk patients. This recommendation follows the consensus
statement: Guidelines for Management of Incidental Pulmonary Nodules
Detected on CT Images: From the [HOSPITAL] 4069; Radiology
